# Patient Record
Sex: Male | Born: 1990 | Race: Black or African American | Hispanic: No | Marital: Married | Smoking: Current some day smoker
Health system: Southern US, Community
[De-identification: ages and names within clinical notes are randomized; demographics above are authoritative.]

## PROBLEM LIST (undated history)

## (undated) DIAGNOSIS — G43909 Migraine, unspecified, not intractable, without status migrainosus: Secondary | ICD-10-CM

## (undated) DIAGNOSIS — R569 Unspecified convulsions: Secondary | ICD-10-CM

## (undated) HISTORY — PX: OTHER SURGICAL HISTORY: SHX169

---
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
## 2010-02-20 ENCOUNTER — Emergency Department: Payer: Self-pay | Admitting: Emergency Medicine

## 2011-01-30 ENCOUNTER — Emergency Department: Payer: Self-pay | Admitting: Unknown Physician Specialty

## 2011-04-30 ENCOUNTER — Emergency Department: Payer: Self-pay | Admitting: *Deleted

## 2013-09-26 ENCOUNTER — Emergency Department: Payer: Self-pay | Admitting: Emergency Medicine

## 2015-07-30 ENCOUNTER — Encounter: Payer: Self-pay | Admitting: *Deleted

## 2015-07-30 ENCOUNTER — Emergency Department: Payer: Self-pay

## 2015-07-30 ENCOUNTER — Observation Stay
Admission: EM | Admit: 2015-07-30 | Discharge: 2015-08-01 | Disposition: A | Discharge status: Home / Self Care | Payer: Self-pay | Attending: Internal Medicine | Admitting: Internal Medicine

## 2015-07-30 DIAGNOSIS — G40909 Epilepsy, unspecified, not intractable, without status epilepticus: Secondary | ICD-10-CM | POA: Insufficient documentation

## 2015-07-30 DIAGNOSIS — F17210 Nicotine dependence, cigarettes, uncomplicated: Secondary | ICD-10-CM | POA: Insufficient documentation

## 2015-07-30 DIAGNOSIS — E669 Obesity, unspecified: Secondary | ICD-10-CM | POA: Insufficient documentation

## 2015-07-30 DIAGNOSIS — Z6834 Body mass index (BMI) 34.0-34.9, adult: Secondary | ICD-10-CM | POA: Insufficient documentation

## 2015-07-30 DIAGNOSIS — R569 Unspecified convulsions: Secondary | ICD-10-CM

## 2015-07-30 DIAGNOSIS — R509 Fever, unspecified: Secondary | ICD-10-CM | POA: Insufficient documentation

## 2015-07-30 DIAGNOSIS — Z833 Family history of diabetes mellitus: Secondary | ICD-10-CM | POA: Insufficient documentation

## 2015-07-30 DIAGNOSIS — R519 Headache, unspecified: Secondary | ICD-10-CM

## 2015-07-30 DIAGNOSIS — M542 Cervicalgia: Secondary | ICD-10-CM | POA: Insufficient documentation

## 2015-07-30 DIAGNOSIS — Z23 Encounter for immunization: Secondary | ICD-10-CM | POA: Insufficient documentation

## 2015-07-30 DIAGNOSIS — G039 Meningitis, unspecified: Secondary | ICD-10-CM | POA: Diagnosis present

## 2015-07-30 DIAGNOSIS — G030 Nonpyogenic meningitis: Principal | ICD-10-CM | POA: Insufficient documentation

## 2015-07-30 DIAGNOSIS — R51 Headache: Secondary | ICD-10-CM | POA: Insufficient documentation

## 2015-07-30 DIAGNOSIS — G43909 Migraine, unspecified, not intractable, without status migrainosus: Secondary | ICD-10-CM | POA: Insufficient documentation

## 2015-07-30 DIAGNOSIS — Z9114 Patient's other noncompliance with medication regimen: Secondary | ICD-10-CM | POA: Insufficient documentation

## 2015-07-30 HISTORY — DX: Unspecified convulsions: R56.9

## 2015-07-30 HISTORY — DX: Migraine, unspecified, not intractable, without status migrainosus: G43.909

## 2015-07-30 MED ORDER — VANCOMYCIN HCL 10 G IV SOLR
2000.0000 mg | Freq: Once | INTRAVENOUS | Status: AC
  Administered 2015-07-31: 05:00:00 2000 mg via INTRAVENOUS
  Filled 2015-07-30: qty 2000

## 2015-07-30 MED ORDER — DEXTROSE 5 % IV SOLN
1000.0000 mg | Freq: Once | INTRAVENOUS | Status: AC
  Administered 2015-07-31: 1000 mg via INTRAVENOUS
  Filled 2015-07-30: qty 20

## 2015-07-30 MED ORDER — MORPHINE SULFATE (PF) 4 MG/ML IV SOLN
4.0000 mg | Freq: Once | INTRAVENOUS | Status: AC
  Administered 2015-07-31: 4 mg via INTRAVENOUS
  Filled 2015-07-30: qty 1

## 2015-07-30 MED ORDER — ONDANSETRON HCL 4 MG/2ML IJ SOLN
4.0000 mg | Freq: Once | INTRAMUSCULAR | Status: AC
  Administered 2015-07-31: 4 mg via INTRAVENOUS
  Filled 2015-07-30: qty 2

## 2015-07-30 MED ORDER — DEXAMETHASONE SODIUM PHOSPHATE 10 MG/ML IJ SOLN
10.0000 mg | Freq: Once | INTRAMUSCULAR | Status: AC
  Administered 2015-07-31: 10 mg via INTRAVENOUS
  Filled 2015-07-30: qty 1

## 2015-07-30 MED ORDER — DEXTROSE 5 % IV SOLN
2.0000 g | INTRAVENOUS | Status: AC
  Administered 2015-07-31: 2 g via INTRAVENOUS
  Filled 2015-07-30: qty 2

## 2015-07-30 MED ORDER — SODIUM CHLORIDE 0.9 % IV BOLUS (SEPSIS)
1000.0000 mL | INTRAVENOUS | Status: AC
  Administered 2015-07-31: 1000 mL via INTRAVENOUS

## 2015-07-30 NOTE — ED Provider Notes (Signed)
[facility] Emergency Department Provider Note  ____________________________________________  Time seen: Approximately 11:17 PM  I have reviewed the triage vital signs and the nursing notes.   HISTORY  Chief Complaint Seizures    HPI Ricky Alvarado is a 24 y.o. male with a past medical history that includes seizure disorder previously on Dilantin but noncompliant with his medications for 2 years as well as a history of migraines who presents by EMS after a reported seizure episodeby family.  He states that he has not felt well all day and his daughter has been ill with an unspecified viral illness.  He has been much tighter than usual and has had a severe headache all day today.  He went to sleep for about 6 hours as afternoon.  When he got up he was feeling more ill but without anything specific except for the headache and photophobia.  He got up and was walking into another room and then does not remember what happened.  One of his family members reported that they heard a loud thump and when I went into the room they found him twitching and unresponsive on the floor.  It is unclear how long this episode lasted.  By the time I evaluated him in the emergency department he is alert and oriented complaining of photophobia, headache, and neck pain.  He has not had any nausea, vomiting, or diarrhea although he said his stomach feels a little bit messed up.  He had a fever of 101 upon arrival to the emergency department.  He states that his neck was not sore before his episode but that it is sore now all over when he moves it.  Moving his head and looking at bright lights makes it worse and nothing makes it better.  Overall he describes his symptoms as severe.   Past Medical History  Diagnosis Date  . Seizures (HCC)   . Migraine     There are no active problems to display for this patient.   History reviewed. No pertinent past surgical history.  No current  outpatient prescriptions on file.  Allergies Review of patient's allergies indicates no known allergies.  History reviewed. No pertinent family history.  Social History Social History  Substance Use Topics  . Smoking status: Current Every Day Smoker    Types: Cigarettes  . Smokeless tobacco: Never Used  . Alcohol Use: No    Review of Systems Constitutional: Fever to 101 Eyes: No visual changes.  Photophobia. ENT: No sore throat. Cardiovascular: Denies chest pain. Respiratory: Denies shortness of breath. Gastrointestinal: No abdominal pain.  No nausea, no vomiting.  No diarrhea.  No constipation. "Gassy" Genitourinary: Negative for dysuria. Musculoskeletal: Negative for back pain.  Has pain in his neck with movement Skin: Negative for rash. Neurological: Rear headache with photophobia.  Previously he reports numbness in his legs but it is now absent.  10-point ROS otherwise negative.  ____________________________________________   PHYSICAL EXAM:  VITAL SIGNS: ED Triage Vitals  Enc Vitals Group     BP 07/30/15 2256 150/84 mmHg     Pulse Rate 07/30/15 2256 93     Resp 07/30/15 2256 16     Temp 07/30/15 2256 101 F (38.3 C)     Temp Source 07/30/15 2256 Oral     SpO2 07/30/15 2256 97 %     Weight 07/30/15 2256 266 lb (120.657 kg)     Height 07/30/15 2256 6\' 2"  (1.88 m)     Head Cir --  Peak Flow --      Pain Score 07/30/15 2257 9     Pain Loc --      Pain Edu? --      Excl. in GC? --     Constitutional: Alert and oriented.  Appears uncomfortable but nontoxic Eyes: Conjunctivae are normal. PERRL. EOMI. Head: Atraumatic. Nose: No congestion/rhinnorhea. Mouth/Throat: Mucous membranes are moist.  Oropharynx non-erythematous. Neck: No stridor.  No cervical spinal bony tenderness to palpation but the patient reports soft tissue tenderness and pain throughout his neck Cardiovascular: Borderline tachycardia, regular rhythm. Grossly normal heart sounds.  Good  peripheral circulation. Respiratory: Normal respiratory effort.  No retractions. Lungs CTAB. Gastrointestinal: Soft and nontender. No distention. No abdominal bruits. No CVA tenderness. Musculoskeletal: No lower extremity tenderness nor edema.  No joint effusions. Neurologic:  Normal speech and language. No gross focal neurologic deficits are appreciated.  The patient is able to flex and extend his neck as well as rotating side to side but he does report tenderness when he does so.  However he does not present with a stiff neck or classic meningismus. Skin:  Skin is warm, dry and intact. No rash noted. Psychiatric: Mood and affect are normal. Speech and behavior are normal.  ____________________________________________   LABS (all labs ordered are listed, but only abnormal results are displayed)  Labs Reviewed  CBC WITH DIFFERENTIAL/PLATELET - Abnormal; Notable for the following:    WBC 12.9 (*)    Neutro Abs 9.9 (*)    Monocytes Absolute 1.3 (*)    All other components within normal limits  COMPREHENSIVE METABOLIC PANEL - Abnormal; Notable for the following:    CO2 21 (*)    Total Bilirubin 1.5 (*)    All other components within normal limits  CULTURE, BLOOD (ROUTINE X 2)  CULTURE, BLOOD (ROUTINE X 2)  LACTIC ACID, PLASMA  URINALYSIS COMPLETEWITH MICROSCOPIC ([facility] ONLY)  LACTIC ACID, PLASMA  PROLACTIN   ____________________________________________  EKG  Not indicated ____________________________________________  RADIOLOGY   Ct Head Wo Contrast  07/31/2015  CLINICAL DATA:  Fever, headache, and seizure tonight. Head and neck pain now. History of seizures. Smoker. EXAM: CT HEAD WITHOUT CONTRAST CT CERVICAL SPINE WITHOUT CONTRAST TECHNIQUE: Multidetector CT imaging of the head and cervical spine was performed following the standard protocol without intravenous contrast. Multiplanar CT image reconstructions of the cervical spine were also generated. COMPARISON:  CT head  04/30/2011 FINDINGS: CT HEAD FINDINGS Ventricles and sulci appear symmetrical. No ventricular dilatation. No mass effect or midline shift. No abnormal extra-axial fluid collections. Gray-white matter junctions are distinct. Basal cisterns are not effaced. No evidence of acute intracranial hemorrhage. No depressed skull fractures. Visualized paranasal sinuses and mastoid air cells are not opacified. CT CERVICAL SPINE FINDINGS Straightening of the usual cervical lordosis. This may be due to patient positioning but ligamentous injury or muscle spasm could also have this appearance and is not excluded. No anterior subluxation. Normal alignment of the posterior elements. No vertebral compression deformities. Intervertebral disc space heights are preserved. No prevertebral soft tissue swelling. C1-2 articulation appears intact. No focal bone lesion or bone destruction. Bone cortex and trabecular architecture appear intact. IMPRESSION: No acute intracranial abnormalities. Nonspecific straightening of usual cervical lordosis. No acute displaced fractures identified. Electronically Signed   By: Burman Nieves M.D.   On: 07/31/2015 00:16   Ct Cervical Spine Wo Contrast  07/31/2015  CLINICAL DATA:  Fever, headache, and seizure tonight. Head and neck pain now. History of seizures. Smoker. EXAM: CT  HEAD WITHOUT CONTRAST CT CERVICAL SPINE WITHOUT CONTRAST TECHNIQUE: Multidetector CT imaging of the head and cervical spine was performed following the standard protocol without intravenous contrast. Multiplanar CT image reconstructions of the cervical spine were also generated. COMPARISON:  CT head 04/30/2011 FINDINGS: CT HEAD FINDINGS Ventricles and sulci appear symmetrical. No ventricular dilatation. No mass effect or midline shift. No abnormal extra-axial fluid collections. Gray-white matter junctions are distinct. Basal cisterns are not effaced. No evidence of acute intracranial hemorrhage. No depressed skull fractures.  Visualized paranasal sinuses and mastoid air cells are not opacified. CT CERVICAL SPINE FINDINGS Straightening of the usual cervical lordosis. This may be due to patient positioning but ligamentous injury or muscle spasm could also have this appearance and is not excluded. No anterior subluxation. Normal alignment of the posterior elements. No vertebral compression deformities. Intervertebral disc space heights are preserved. No prevertebral soft tissue swelling. C1-2 articulation appears intact. No focal bone lesion or bone destruction. Bone cortex and trabecular architecture appear intact. IMPRESSION: No acute intracranial abnormalities. Nonspecific straightening of usual cervical lordosis. No acute displaced fractures identified. Electronically Signed   By: Burman Nieves M.D.   On: 07/31/2015 00:16    ____________________________________________   PROCEDURES  Procedure(s) performed: None  Critical Care performed: Yes, see critical care note(s)  CRITICAL CARE Performed by: Loleta Rose   Total critical care time: 30 minutes  Critical care time was exclusive of separately billable procedures and treating other patients.  Critical care was necessary to treat or prevent imminent or life-threatening deterioration.  Critical care was time spent personally by me on the following activities: development of treatment plan with patient and/or surrogate as well as nursing, discussions with consultants, evaluation of patient's response to treatment, examination of patient, obtaining history from patient or surrogate, ordering and performing treatments and interventions, ordering and review of laboratory studies, ordering and review of radiographic studies, pulse oximetry and re-evaluation of patient's condition.  ____________________________________________   INITIAL IMPRESSION / ASSESSMENT AND PLAN / ED COURSE  Pertinent labs & imaging results that were available during my care of the  patient were reviewed by me and considered in my medical decision making (see chart for details).  The patient is alert and oriented at this time.  Given his constellation of symptoms that include fever, headache, photophobia, seizure disorder, neck pain, and no other specific focus or obvious source of infection, there is concern for meningitis.  I ordered airborne precautions and empiric ceftriaxone 2 g IV, vancomycin 2 g IV, and acyclovir 1 g IV as well as Decadron 10 mg IV.  I am obtaining a head CT and C-spine CT both to evaluate for the headache and seizure and possible meningitis as well as for his fall.  I saw the patient immediately after was told about him and explained our concerns.  I had my usual and customary discussion about the risks of meningitis including death and permanent disability and explained in detail why we need to obtain a lumbar puncture.  The patient adamantly refuses and states that he would rather die than have a needle put in his back.  I tried twice to explain why this is important it was unsuccessful.  My charge nurse, Fleet Contras, also discussed with the patient even while the patient was on the phone with his mother, and he continues to refuse the lumbar puncture.  He has capacity to make this decision and I cannot force a needle into his back.  I will treat him empirically and  complete the rest of my workup and plan to admit him for observation given the concern for possible meningitis.  I have discussed it with the hospitalist who will evaluate and admit the patient as per our discussion.  ____________________________________________  FINAL CLINICAL IMPRESSION(S) / ED DIAGNOSES  Final diagnoses:  Possible meningitis, likely viral Acute intractable headache, unspecified headache type  Fever, unspecified fever cause  Seizure (HCC)  Neck pain      NEW MEDICATIONS STARTED DURING THIS VISIT:  New Prescriptions   No medications on file     Loleta Rose,  MD 07/31/15 [phone]

## 2015-07-30 NOTE — ED Notes (Signed)
Pt presents via EMS post seizure, unknown if witnessed. Pt is febrile and somnolent. Pt c/o headache and neck pain. Pt states he fell and hit his head.

## 2015-07-31 DIAGNOSIS — G039 Meningitis, unspecified: Secondary | ICD-10-CM | POA: Diagnosis present

## 2015-07-31 LAB — CBC WITH DIFFERENTIAL/PLATELET
Basophils Absolute: 0.1 K/uL (ref 0–0.1)
Basophils Relative: 0 %
Eosinophils Absolute: 0.3 K/uL (ref 0–0.7)
Eosinophils Relative: 3 %
HCT: 46.5 % (ref 40.0–52.0)
Hemoglobin: 15.3 g/dL (ref 13.0–18.0)
Lymphocytes Relative: 10 %
Lymphs Abs: 1.3 K/uL (ref 1.0–3.6)
MCH: 31.8 pg (ref 26.0–34.0)
MCHC: 33 g/dL (ref 32.0–36.0)
MCV: 96.3 fL (ref 80.0–100.0)
Monocytes Absolute: 1.3 K/uL — ABNORMAL HIGH (ref 0.2–1.0)
Monocytes Relative: 10 %
Neutro Abs: 9.9 K/uL — ABNORMAL HIGH (ref 1.4–6.5)
Neutrophils Relative %: 77 %
Platelets: 187 K/uL (ref 150–440)
RBC: 4.83 MIL/uL (ref 4.40–5.90)
RDW: 13.1 % (ref 11.5–14.5)
WBC: 12.9 K/uL — ABNORMAL HIGH (ref 3.8–10.6)

## 2015-07-31 LAB — URINALYSIS COMPLETE WITH MICROSCOPIC (ARMC ONLY)
Bacteria, UA: NONE SEEN
Bilirubin Urine: NEGATIVE
Glucose, UA: NEGATIVE mg/dL
Ketones, ur: NEGATIVE mg/dL
Leukocytes, UA: NEGATIVE
Nitrite: NEGATIVE
Protein, ur: NEGATIVE mg/dL
Specific Gravity, Urine: 1.014 (ref 1.005–1.030)
pH: 5 (ref 5.0–8.0)

## 2015-07-31 LAB — COMPREHENSIVE METABOLIC PANEL WITH GFR
ALT: 50 U/L (ref 17–63)
AST: 26 U/L (ref 15–41)
Albumin: 4.6 g/dL (ref 3.5–5.0)
Alkaline Phosphatase: 56 U/L (ref 38–126)
Anion gap: 8 (ref 5–15)
BUN: 13 mg/dL (ref 6–20)
CO2: 21 mmol/L — ABNORMAL LOW (ref 22–32)
Calcium: 8.9 mg/dL (ref 8.9–10.3)
Chloride: 108 mmol/L (ref 101–111)
Creatinine, Ser: 1.1 mg/dL (ref 0.61–1.24)
GFR calc Af Amer: >60 mL/min
GFR calc non Af Amer: >60 mL/min
Glucose, Bld: 90 mg/dL (ref 65–99)
Potassium: 3.7 mmol/L (ref 3.5–5.1)
Sodium: 137 mmol/L (ref 135–145)
Total Bilirubin: 1.5 mg/dL — ABNORMAL HIGH (ref 0.3–1.2)
Total Protein: 7.5 g/dL (ref 6.5–8.1)

## 2015-07-31 LAB — TSH: TSH: 1.272 u[IU]/mL (ref 0.350–4.500)

## 2015-07-31 LAB — LACTIC ACID, PLASMA
Lactic Acid, Venous: 1.1 mmol/L (ref 0.5–2.0)
Lactic Acid, Venous: 1.4 mmol/L (ref 0.5–2.0)

## 2015-07-31 LAB — HEMOGLOBIN A1C: Hgb A1c MFr Bld: 5.7 % (ref 4.0–6.0)

## 2015-07-31 MED ORDER — MORPHINE SULFATE (PF) 2 MG/ML IV SOLN
2.0000 mg | INTRAVENOUS | Status: DC | PRN
  Administered 2015-07-31 (×2): 2 mg via INTRAVENOUS
  Filled 2015-07-31 (×2): qty 1

## 2015-07-31 MED ORDER — SODIUM CHLORIDE 0.9 % IJ SOLN
3.0000 mL | Freq: Two times a day (BID) | INTRAMUSCULAR | Status: DC
  Administered 2015-07-31 (×2): 3 mL via INTRAVENOUS

## 2015-07-31 MED ORDER — SODIUM CHLORIDE 0.9 % IV SOLN: 10.0000 mg/kg | INTRAVENOUS | Status: DC

## 2015-07-31 MED ORDER — ONDANSETRON HCL 4 MG/2ML IJ SOLN: 4.0000 mg | Freq: Four times a day (QID) | INTRAMUSCULAR | Status: DC | PRN

## 2015-07-31 MED ORDER — SODIUM CHLORIDE 0.9 % IV SOLN
INTRAVENOUS | Status: DC
  Administered 2015-07-31 (×2): via INTRAVENOUS

## 2015-07-31 MED ORDER — SODIUM CHLORIDE 0.9 % IV SOLN
1200.0000 mg | INTRAVENOUS | Status: AC
  Administered 2015-07-31: 1200 mg via INTRAVENOUS
  Filled 2015-07-31: qty 24

## 2015-07-31 MED ORDER — ACETAMINOPHEN 650 MG RE SUPP: 650.0000 mg | Freq: Four times a day (QID) | RECTAL | Status: DC | PRN

## 2015-07-31 MED ORDER — PHENYTOIN SODIUM EXTENDED 100 MG PO CAPS
300.0000 mg | ORAL_CAPSULE | Freq: Every day | ORAL | Status: DC
  Administered 2015-07-31: 21:00:00 300 mg via ORAL
  Filled 2015-07-31: qty 3

## 2015-07-31 MED ORDER — INFLUENZA VAC SPLIT QUAD 0.5 ML IM SUSY
0.5000 mL | PREFILLED_SYRINGE | INTRAMUSCULAR | Status: AC
  Administered 2015-08-01: 12:00:00 0.5 mL via INTRAMUSCULAR
  Filled 2015-07-31: qty 0.5

## 2015-07-31 MED ORDER — BUTALBITAL-APAP-CAFFEINE 50-325-40 MG PO TABS
1.0000 | ORAL_TABLET | ORAL | Status: DC | PRN
  Administered 2015-07-31: 13:00:00 1 via ORAL
  Filled 2015-07-31: qty 1

## 2015-07-31 MED ORDER — PNEUMOCOCCAL VAC POLYVALENT 25 MCG/0.5ML IJ INJ
0.5000 mL | INJECTION | INTRAMUSCULAR | Status: AC
  Administered 2015-08-01: 12:00:00 0.5 mL via INTRAMUSCULAR
  Filled 2015-07-31: qty 0.5

## 2015-07-31 MED ORDER — ACETAMINOPHEN 325 MG PO TABS: 650.0000 mg | ORAL_TABLET | Freq: Four times a day (QID) | ORAL | Status: DC | PRN

## 2015-07-31 MED ORDER — ONDANSETRON HCL 4 MG PO TABS: 4.0000 mg | ORAL_TABLET | Freq: Four times a day (QID) | ORAL | Status: DC | PRN

## 2015-07-31 NOTE — H&P (Signed)
Ricky Alvarado is an 24 y.o. male.   Chief Complaint: Seizure HPI: The patient presents emergency department complaining of fever, general malaise and seizure. The patient states that he began feeling unwell this afternoon. He's had some sick contacts including his infant daughter. He admits to feeling feverish this afternoon and had a temperature of 101F in the emergency department. Wife states that he was found seizing on the floor by his sister. She reports that it was tonic-clonic involving his whole body. The seizure reportedly last 2-3 minutes. The patient reports history of seizures as a child, the last which was at least 3 years ago. He does not take medicine for his seizure disorder. Emergency department the patient also complained of photophobia as well as neck stiffness. The emergency department physician recommended lumbar puncture but the patient refused. Emergency department initiated antibiotic coverage as well as antiviral coverage for presumed meningitis and called the hospitalist service for admission.  Past Medical History  Diagnosis Date  . Seizures ([place])   . Migraine     Past Surgical History  Procedure Laterality Date  . None       Family History  Problem Relation Age of Onset  . Diabetes Mellitus II Mother     Social History:  reports that he has been smoking Cigarettes.  He has been smoking about 0.50 packs per day. He has never used smokeless tobacco. He reports that he does not drink alcohol or use illicit drugs.  Allergies: No Known Allergies  No prescriptions prior to admission    Results for orders placed or performed during the hospital encounter of 07/30/15 (from the past 48 hour(s))  TSH     Status: None   Collection Time: 07/30/15  3:23 AM  Result Value Ref Range   TSH 1.272 0.350 - 4.500 uIU/mL  CBC with Differential/Platelet     Status: Abnormal   Collection Time: 07/31/15 12:28 AM  Result Value Ref Range   WBC 12.9 (H) 3.8 - 10.6 K/uL   RBC  4.83 4.40 - 5.90 MIL/uL   Hemoglobin 15.3 13.0 - 18.0 g/dL   HCT 46.5 40.0 - 52.0 %   MCV 96.3 80.0 - 100.0 fL   MCH 31.8 26.0 - 34.0 pg   MCHC 33.0 32.0 - 36.0 g/dL   RDW 13.1 11.5 - 14.5 %   Platelets 187 150 - 440 K/uL   Neutrophils Relative % 77 %   Neutro Abs 9.9 (H) 1.4 - 6.5 K/uL   Lymphocytes Relative 10 %   Lymphs Abs 1.3 1.0 - 3.6 K/uL   Monocytes Relative 10 %   Monocytes Absolute 1.3 (H) 0.2 - 1.0 K/uL   Eosinophils Relative 3 %   Eosinophils Absolute 0.3 0 - 0.7 K/uL   Basophils Relative 0 %   Basophils Absolute 0.1 0 - 0.1 K/uL  Comprehensive metabolic panel     Status: Abnormal   Collection Time: 07/31/15 12:28 AM  Result Value Ref Range   Sodium 137 135 - 145 mmol/L   Potassium 3.7 3.5 - 5.1 mmol/L   Chloride 108 101 - 111 mmol/L   CO2 21 (L) 22 - 32 mmol/L   Glucose, Bld 90 65 - 99 mg/dL   BUN 13 6 - 20 mg/dL   Creatinine, Ser 1.10 0.61 - 1.24 mg/dL   Calcium 8.9 8.9 - 10.3 mg/dL   Total Protein 7.5 6.5 - 8.1 g/dL   Albumin 4.6 3.5 - 5.0 g/dL   AST 26 15 - 41  U/L   ALT 50 17 - 63 U/L   Alkaline Phosphatase 56 38 - 126 U/L   Total Bilirubin 1.5 (H) 0.3 - 1.2 mg/dL   GFR calc non Af Amer >60 >60 mL/min   GFR calc Af Amer >60 >60 mL/min    Comment: (NOTE) The eGFR has been calculated using the CKD EPI equation. This calculation has not been validated in all clinical situations. eGFR's persistently <60 mL/min signify possible Chronic Kidney Disease.    Anion gap 8 5 - 15  Lactic acid, plasma     Status: None   Collection Time: 07/31/15 12:28 AM  Result Value Ref Range   Lactic Acid, Venous 1.1 0.5 - 2.0 mmol/L  Urinalysis complete, with microscopic ([facility] only)     Status: Abnormal   Collection Time: 07/31/15  2:27 AM  Result Value Ref Range   Color, Urine YELLOW (A) YELLOW   APPearance CLEAR (A) CLEAR   Glucose, UA NEGATIVE NEGATIVE mg/dL   Bilirubin Urine NEGATIVE NEGATIVE   Ketones, ur NEGATIVE NEGATIVE mg/dL   Specific Gravity, Urine 1.014 1.005  - 1.030   Hgb urine dipstick 1+ (A) NEGATIVE   pH 5.0 5.0 - 8.0   Protein, ur NEGATIVE NEGATIVE mg/dL   Nitrite NEGATIVE NEGATIVE   Leukocytes, UA NEGATIVE NEGATIVE   RBC / HPF 0-5 0 - 5 RBC/hpf   WBC, UA 0-5 0 - 5 WBC/hpf   Bacteria, UA NONE SEEN NONE SEEN   Squamous Epithelial / LPF 0-5 (A) NONE SEEN   Mucous PRESENT   Lactic acid, plasma     Status: None   Collection Time: 07/31/15  3:51 AM  Result Value Ref Range   Lactic Acid, Venous 1.4 0.5 - 2.0 mmol/L   Ct Head Wo Contrast  07/31/2015  CLINICAL DATA:  Fever, headache, and seizure tonight. Head and neck pain now. History of seizures. Smoker. EXAM: CT HEAD WITHOUT CONTRAST CT CERVICAL SPINE WITHOUT CONTRAST TECHNIQUE: Multidetector CT imaging of the head and cervical spine was performed following the standard protocol without intravenous contrast. Multiplanar CT image reconstructions of the cervical spine were also generated. COMPARISON:  CT head 04/30/2011 FINDINGS: CT HEAD FINDINGS Ventricles and sulci appear symmetrical. No ventricular dilatation. No mass effect or midline shift. No abnormal extra-axial fluid collections. Gray-white matter junctions are distinct. Basal cisterns are not effaced. No evidence of acute intracranial hemorrhage. No depressed skull fractures. Visualized paranasal sinuses and mastoid air cells are not opacified. CT CERVICAL SPINE FINDINGS Straightening of the usual cervical lordosis. This may be due to patient positioning but ligamentous injury or muscle spasm could also have this appearance and is not excluded. No anterior subluxation. Normal alignment of the posterior elements. No vertebral compression deformities. Intervertebral disc space heights are preserved. No prevertebral soft tissue swelling. C1-2 articulation appears intact. No focal bone lesion or bone destruction. Bone cortex and trabecular architecture appear intact. IMPRESSION: No acute intracranial abnormalities. Nonspecific straightening of usual  cervical lordosis. No acute displaced fractures identified. Electronically Signed   By: Lucienne Capers M.D.   On: 07/31/2015 00:16   Ct Cervical Spine Wo Contrast  07/31/2015  CLINICAL DATA:  Fever, headache, and seizure tonight. Head and neck pain now. History of seizures. Smoker. EXAM: CT HEAD WITHOUT CONTRAST CT CERVICAL SPINE WITHOUT CONTRAST TECHNIQUE: Multidetector CT imaging of the head and cervical spine was performed following the standard protocol without intravenous contrast. Multiplanar CT image reconstructions of the cervical spine were also generated. COMPARISON:  CT head 04/30/2011  FINDINGS: CT HEAD FINDINGS Ventricles and sulci appear symmetrical. No ventricular dilatation. No mass effect or midline shift. No abnormal extra-axial fluid collections. Gray-white matter junctions are distinct. Basal cisterns are not effaced. No evidence of acute intracranial hemorrhage. No depressed skull fractures. Visualized paranasal sinuses and mastoid air cells are not opacified. CT CERVICAL SPINE FINDINGS Straightening of the usual cervical lordosis. This may be due to patient positioning but ligamentous injury or muscle spasm could also have this appearance and is not excluded. No anterior subluxation. Normal alignment of the posterior elements. No vertebral compression deformities. Intervertebral disc space heights are preserved. No prevertebral soft tissue swelling. C1-2 articulation appears intact. No focal bone lesion or bone destruction. Bone cortex and trabecular architecture appear intact. IMPRESSION: No acute intracranial abnormalities. Nonspecific straightening of usual cervical lordosis. No acute displaced fractures identified. Electronically Signed   By: Lucienne Capers M.D.   On: 07/31/2015 00:16    Review of Systems  Constitutional: Positive for fever and chills.  HENT: Negative for sore throat and tinnitus.   Eyes: Positive for photophobia. Negative for blurred vision and redness.   Respiratory: Negative for cough and shortness of breath.   Cardiovascular: Negative for chest pain, palpitations, orthopnea and PND.  Gastrointestinal: Negative for nausea, vomiting, abdominal pain and diarrhea.  Genitourinary: Negative for dysuria, urgency and frequency.  Musculoskeletal: Negative for myalgias and joint pain.  Skin: Negative for rash.       No lesions  Neurological: Positive for headaches. Negative for speech change, focal weakness and weakness.  Endo/Heme/Allergies: Does not bruise/bleed easily.       No temperature intolerance  Psychiatric/Behavioral: Negative for depression and suicidal ideas.    Blood pressure 123/70, pulse 70, temperature 97.8 F (36.6 C), temperature source Oral, resp. rate 18, height _0  (1.88 m), weight 123.106 kg (271 lb 6.4 oz), SpO2 100 %. Physical Exam  Nursing note and vitals reviewed. Constitutional: He is oriented to person, place, and time. He appears well-developed and well-nourished. No distress.  HENT:  Head: Normocephalic and atraumatic.  Mouth/Throat: Oropharynx is clear and moist.  Eyes: Conjunctivae and EOM are normal. Pupils are equal, round, and reactive to light. No scleral icterus.  Neck: Normal range of motion. Neck supple. No JVD present. No tracheal deviation present. No thyromegaly present.  Cardiovascular: Normal rate, regular rhythm and normal heart sounds.  Exam reveals no gallop and no friction rub.   No murmur heard. Respiratory: Effort normal and breath sounds normal. No respiratory distress. He has no wheezes.  GI: Soft. Bowel sounds are normal. He exhibits no distension. There is no tenderness.  Genitourinary:  Deferred  Musculoskeletal: Normal range of motion. He exhibits no edema.  Lymphadenopathy:    He has no cervical adenopathy.  Neurological: He is alert and oriented to person, place, and time. No cranial nerve deficit.  Skin: Skin is warm and dry. No rash noted. No erythema.  Psychiatric: He has a  normal mood and affect. His behavior is normal. Judgment and thought content normal.     Assessment/Plan This is a 24 year old [place] American male admitted for meningitic symptoms. 1. Meningitis: Clinically present: Likely aseptic. However, the patient will not let us perform lumbar puncture. And tingling to treat for both bacterial and viral meningitis without an endpoint may be unwise but it is clear that he should not be around his infant child until he is cleared for HSV. 2. Fever: Tylenol as needed 3. Seizure: Loaded with Dilantin in the emergency department. Consult  neuro if further seizure activity. May need to restart antiepileptic medication as an outpatient especially if the patient refuses further care for possible meningitis which will lower her seizure threshold. 4. Obesity: BMI is 34.3; encourage healthy diet and exercise 5. Care management consult: Need to establish an of care including discharge disposition regarding the patient's interaction with his daughter if he is not owing to comply with treatment 6. DVT prophylaxis: SCDs due to early ambulation 7. GI prophylaxis: None as the patient is not critically ill The patient is a full code. Time spent on admission orders and patient care approximately 45 minutes  Harrie Foreman 07/31/2015, 5:39 AM

## 2015-07-31 NOTE — Progress Notes (Signed)
[facility] - [place] at [facility]   PATIENT NAME: Ricky Alvarado    MR#:  [id number]  DATE OF BIRTH:  [birth year]  SUBJECTIVE:  Came in with fever 101 point for headache and neck stiffness. Patient seems to be feeling better although does have some generalized headache which he thinks is his migraine headache. No nausea vomiting. Tolerating food well. No fever.  REVIEW OF SYSTEMS:   Review of Systems  Constitutional: Negative for fever, chills and weight loss.  HENT: Negative for ear discharge, ear pain and nosebleeds.   Eyes: Negative for blurred vision, pain and discharge.  Respiratory: Negative for sputum production, shortness of breath, wheezing and stridor.   Cardiovascular: Negative for chest pain, palpitations, orthopnea and PND.  Gastrointestinal: Negative for nausea, vomiting, abdominal pain and diarrhea.  Genitourinary: Negative for urgency and frequency.  Musculoskeletal: Positive for neck pain. Negative for back pain and joint pain.  Neurological: Positive for weakness and headaches. Negative for sensory change, speech change and focal weakness.  Psychiatric/Behavioral: Negative for depression and hallucinations. The patient is not nervous/anxious.   All other systems reviewed and are negative.  Tolerating Diet: Yes Tolerating PT: Not needed  DRUG ALLERGIES:  No Known Allergies  VITALS:  Blood pressure 123/70, pulse 70, temperature 98.7 F (37.1 C), temperature source Oral, resp. rate 18, height 6\' 2"  (1.88 m), weight 123.106 kg (271 lb 6.4 oz), SpO2 100 %.  PHYSICAL EXAMINATION:   Physical Exam  GENERAL:  24 y.o.-year-old patient lying in the bed with no acute distress.  EYES: Pupils equal, round, reactive to light and accommodation. No scleral icterus. Extraocular muscles intact.  HEENT: Head atraumatic, normocephalic. Oropharynx and nasopharynx clear.  NECK:  Supple, no jugular venous distention. No thyroid enlargement, no  tenderness. No neck stiffness LUNGS: Normal breath sounds bilaterally, no wheezing, rales, rhonchi. No use of accessory muscles of respiration.  CARDIOVASCULAR: S1, S2 normal. No murmurs, rubs, or gallops.  ABDOMEN: Soft, nontender, nondistended. Bowel sounds present. No organomegaly or mass.  EXTREMITIES: No cyanosis, clubbing or edema b/l.    NEUROLOGIC: Cranial nerves II through XII are intact. No focal Motor or sensory deficits b/l.   PSYCHIATRIC: The patient is alert and oriented x 3.  SKIN: No obvious rash, lesion, or ulcer.    LABORATORY PANEL:   CBC  Recent Labs Lab 07/31/15 0028  WBC 12.9*  HGB 15.3  HCT 46.5  PLT 187    Chemistries   Recent Labs Lab 07/31/15 0028  NA 137  K 3.7  CL 108  CO2 21*  GLUCOSE 90  BUN 13  CREATININE 1.10  CALCIUM 8.9  AST 26  ALT 50  ALKPHOS 56  BILITOT 1.5*    Cardiac Enzymes No results for input(s): TROPONINI in the last 168 hours.  RADIOLOGY:  Ct Head Wo Contrast  07/31/2015  CLINICAL DATA:  Fever, headache, and seizure tonight. Head and neck pain now. History of seizures. Smoker. EXAM: CT HEAD WITHOUT CONTRAST CT CERVICAL SPINE WITHOUT CONTRAST TECHNIQUE: Multidetector CT imaging of the head and cervical spine was performed following the standard protocol without intravenous contrast. Multiplanar CT image reconstructions of the cervical spine were also generated. COMPARISON:  CT head 04/30/2011 FINDINGS: CT HEAD FINDINGS Ventricles and sulci appear symmetrical. No ventricular dilatation. No mass effect or midline shift. No abnormal extra-axial fluid collections. Gray-white matter junctions are distinct. Basal cisterns are not effaced. No evidence of acute intracranial hemorrhage. No depressed skull fractures. Visualized paranasal sinuses and mastoid air  cells are not opacified. CT CERVICAL SPINE FINDINGS Straightening of the usual cervical lordosis. This may be due to patient positioning but ligamentous injury or muscle spasm  could also have this appearance and is not excluded. No anterior subluxation. Normal alignment of the posterior elements. No vertebral compression deformities. Intervertebral disc space heights are preserved. No prevertebral soft tissue swelling. C1-2 articulation appears intact. No focal bone lesion or bone destruction. Bone cortex and trabecular architecture appear intact. IMPRESSION: No acute intracranial abnormalities. Nonspecific straightening of usual cervical lordosis. No acute displaced fractures identified. Electronically Signed   By: Burman Nieves M.D.   On: 07/31/2015 00:16   Ct Cervical Spine Wo Contrast  07/31/2015  CLINICAL DATA:  Fever, headache, and seizure tonight. Head and neck pain now. History of seizures. Smoker. EXAM: CT HEAD WITHOUT CONTRAST CT CERVICAL SPINE WITHOUT CONTRAST TECHNIQUE: Multidetector CT imaging of the head and cervical spine was performed following the standard protocol without intravenous contrast. Multiplanar CT image reconstructions of the cervical spine were also generated. COMPARISON:  CT head 04/30/2011 FINDINGS: CT HEAD FINDINGS Ventricles and sulci appear symmetrical. No ventricular dilatation. No mass effect or midline shift. No abnormal extra-axial fluid collections. Gray-white matter junctions are distinct. Basal cisterns are not effaced. No evidence of acute intracranial hemorrhage. No depressed skull fractures. Visualized paranasal sinuses and mastoid air cells are not opacified. CT CERVICAL SPINE FINDINGS Straightening of the usual cervical lordosis. This may be due to patient positioning but ligamentous injury or muscle spasm could also have this appearance and is not excluded. No anterior subluxation. Normal alignment of the posterior elements. No vertebral compression deformities. Intervertebral disc space heights are preserved. No prevertebral soft tissue swelling. C1-2 articulation appears intact. No focal bone lesion or bone destruction. Bone cortex  and trabecular architecture appear intact. IMPRESSION: No acute intracranial abnormalities. Nonspecific straightening of usual cervical lordosis. No acute displaced fractures identified. Electronically Signed   By: Burman Nieves M.D.   On: 07/31/2015 00:16   ASSESSMENT AND PLAN:   24 year old African American male admitted for meningitic symptoms. 1. aseptic Meningitis: Clinically present: Likely aseptic.  -Clinical presentation appears to be aseptic meningitis. Patient currently is afebrile. White count is 12.6. Able to tolerate diet. No indications for antibiotics. Supportive care. -No seizures noted. Patient has been off seizure medicines for last 3 years. -Declined lumbar puncture yesterday  2. Fever: Tylenol as needed -Resolved  3. History of Seizure: Loaded with Dilantin in the emergency department.  -No clear-cut history of seizures yesterday however family reports patient shivering/shaking. Given IV Dilantin. We'll continue by mouth Dilantin. --Seizure precautions. -Patient is advised to follow up at [facility] neurology for outpatient medication and follow-up on seizures. He has no insurance  4. Obesity: BMI is 34.3; encourage healthy diet and exercise  5. History of migraine headaches. -Try Fioricet today.  Case discussed with Care Management/Social Worker. Management plans discussed with the patient, family and they are in agreement.  CODE STATUS: Full  DVT Prophylaxis: SCD teds ambulator  TOTAL TIME TAKING CARE OF THIS PATIENT: *30 minutes.  >50% time spent on counselling and coordination of care patient and staff     Briselda Naval M.D on 07/31/2015 at 12:08 PM  Between 7am to 6pm - Pager - [phone]  After 6pm go to [email or web address] - password [email or web address] [facility]  [facility]  Office  [phone]  CC: Primary care physician; No PCP Per Patient

## 2015-07-31 NOTE — Plan of Care (Signed)
Problem: Pain Managment: Goal: General experience of comfort will improve Outcome: Progressing Patient admitted this shift for headache and questionable seziure activity, afebrile since admission did report fever in ed, patient refused  Lumbar puncture, on droplet precations since unable to rule out menengitis. Patient is high fall risk, call bell in reach.

## 2015-07-31 NOTE — Progress Notes (Signed)
Patient c/o pain x2 relieved well with prn morphine 2mg  and Fioricet relieved headache well  IV fluids discontinued this shift No Seizure activity noted patient A&O this shift

## 2015-08-01 LAB — PROLACTIN: PROLACTIN: 7.2 ng/mL (ref 4.0–15.2)

## 2015-08-01 MED ORDER — PHENYTOIN SODIUM EXTENDED 300 MG PO CAPS: 300.0000 mg | ORAL_CAPSULE | Freq: Every day | ORAL | Status: DC

## 2015-08-01 NOTE — Care Management (Addendum)
Spoke with patient regarding discharge planning. He does not have any insurance and states he has not been taking his Dilantin because "he cannot afford it". He is aware of [facility] but has not followed up. He has never been told about [facility] or [facility] but agrees.  Referral made by this RNCM to both. Patient states his contact is [phone]. Application to both delivered to this patient. 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 He states he has made multiple attempts to "get [place] [facility] but all times they have denied him". No further RNCM needs. Case closed.

## 2015-08-01 NOTE — Discharge Summary (Signed)
[facility] - [place] at [facility]   PATIENT NAME: Ricky Alvarado    MR#:  [id number]  DATE OF BIRTH:  05-21-1991  DATE OF ADMISSION:  07/30/2015 ADMITTING PHYSICIAN: Arnaldo Natal, MD  DATE OF DISCHARGE: 08/01/15  PRIMARY CARE PHYSICIAN: No PCP Per Patient    ADMISSION DIAGNOSIS:  Neck pain [M54.2] Seizure (HCC) [R56.9] Fever, unspecified fever cause [R50.9] Acute intractable headache, unspecified headache type [R51]  DISCHARGE DIAGNOSIS:  Aseptic meningitis H/o seizures with possible recurrent at home (per family)  SECONDARY DIAGNOSIS:   Past Medical History  Diagnosis Date  . Seizures (HCC)   . Migraine     HOSPITAL COURSE:  24 year old African American male admitted for meningitic symptoms. 1. aseptic Meningitis: Likely aseptic.  -Clinical presentation appears to be aseptic meningitis. Patient currently is afebrile. White count is 12.6. Able to tolerate diet. No indications for antibiotics. Supportive care. -No seizures noted. Patient has been off seizure medicines for last 3 years. -Declined lumbar puncture yesterday  2. Fever: Tylenol as needed -Resolved  3. History of Seizure: Loaded with Dilantin in the emergency department.  -No clear-cut history of seizures yesterday however family reports patient shivering/shaking. Given IV Dilantin. We'll continue by mouth Dilantin. --Seizure precautions. -Patient is advised to follow up at [facility] neurology for outpatient medication and follow-up on seizures. He has no insurance -he is advised not to drive till cleared by his PCP or if he gets to see neuro at [facility]  4. Obesity: BMI is 34.3; encourage healthy diet and exercise  5. History of migraine headaches. -resovled  D/c home  CONSULTS OBTAINED:   done DRUG ALLERGIES:  No Known Allergies  DISCHARGE MEDICATIONS:   Current Discharge Medication List    START taking these medications   Details  phenytoin (DILANTIN) 300 MG ER  capsule Take 1 capsule (300 mg total) by mouth at bedtime. Qty: 30 capsule, Refills: 3        If you experience worsening of your admission symptoms, develop shortness of breath, life threatening emergency, suicidal or homicidal thoughts you must seek medical attention immediately by calling 911 or calling your MD immediately  if symptoms less severe.  You Must read complete instructions/literature along with all the possible adverse reactions/side effects for all the Medicines you take and that have been prescribed to you. Take any new Medicines after you have completely understood and accept all the possible adverse reactions/side effects.   Please note  You were cared for by a hospitalist during your hospital stay. If you have any questions about your discharge medications or the care you received while you were in the hospital after you are discharged, you can call the unit and asked to speak with the hospitalist on call if the hospitalist that took care of you is not available. Once you are discharged, your primary care physician will handle any further medical issues. Please note that NO REFILLS for any discharge medications will be authorized once you are discharged, as it is imperative that you return to your primary care physician (or establish a relationship with a primary care physician if you do not have one) for your aftercare needs so that they can reassess your need for medications and monitor your lab values. Today   SUBJECTIVE   Doing well. No HA. Eating well No seizures reported  VITAL SIGNS:  Blood pressure 128/78, pulse 70, temperature 98.1 F (36.7 C), temperature source Oral, resp. rate 18, height  (1.88 m), weight 123.106  kg (271 lb 6.4 oz), SpO2 98 %.  I/O:   Intake/Output Summary (Last 24 hours) at 08/01/15 0704 Last data filed at 07/31/15 1700  Gross per 24 hour  Intake      0 ml  Output    550 ml  Net   -550 ml    PHYSICAL EXAMINATION:  GENERAL:  24  y.o.-year-old patient lying in the bed with no acute distress.  EYES: Pupils equal, round, reactive to light and accommodation. No scleral icterus. Extraocular muscles intact.  HEENT: Head atraumatic, normocephalic. Oropharynx and nasopharynx clear.  NECK:  Supple, no jugular venous distention. No thyroid enlargement, no tenderness.  LUNGS: Normal breath sounds bilaterally, no wheezing, rales,rhonchi or crepitation. No use of accessory muscles of respiration.  CARDIOVASCULAR: S1, S2 normal. No murmurs, rubs, or gallops.  ABDOMEN: Soft, non-tender, non-distended. Bowel sounds present. No organomegaly or mass.  EXTREMITIES: No pedal edema, cyanosis, or clubbing.  NEUROLOGIC: Cranial nerves II through XII are intact. Muscle strength 5/5 in all extremities. Sensation intact. Gait not checked.  PSYCHIATRIC: The patient is alert and oriented x 3.  SKIN: No obvious rash, lesion, or ulcer.   DATA REVIEW:   CBC   Recent Labs Lab 07/31/15 0028  WBC 12.9*  HGB 15.3  HCT 46.5  PLT 187    Chemistries   Recent Labs Lab 07/31/15 0028  NA 137  K 3.7  CL 108  CO2 21*  GLUCOSE 90  BUN 13  CREATININE 1.10  CALCIUM 8.9  AST 26  ALT 50  ALKPHOS 56  BILITOT 1.5*    Microbiology Results   Recent Results (from the past 240 hour(s))  Blood culture (routine x 2)     Status: None (Preliminary result)   Collection Time: 07/31/15 12:28 AM  Result Value Ref Range Status   Specimen Description BLOOD RIGHT ASSIST CONTROL  Final   Special Requests BOTTLES DRAWN AEROBIC AND ANAEROBIC 4CC  Final   Culture NO GROWTH < 12 HOURS  Final   Report Status PENDING  Incomplete  Blood culture (routine x 2)     Status: None (Preliminary result)   Collection Time: 07/31/15 12:28 AM  Result Value Ref Range Status   Specimen Description BLOOD LEFT ASSIST CONTROL  Final   Special Requests BOTTLES DRAWN AEROBIC AND ANAEROBIC 4CC  Final   Culture NO GROWTH < 12 HOURS  Final   Report Status PENDING   Incomplete    RADIOLOGY:  Ct Head Wo Contrast  07/31/2015  CLINICAL DATA:  Fever, headache, and seizure tonight. Head and neck pain now. History of seizures. Smoker. EXAM: CT HEAD WITHOUT CONTRAST CT CERVICAL SPINE WITHOUT CONTRAST TECHNIQUE: Multidetector CT imaging of the head and cervical spine was performed following the standard protocol without intravenous contrast. Multiplanar CT image reconstructions of the cervical spine were also generated. COMPARISON:  CT head 04/30/2011 FINDINGS: CT HEAD FINDINGS Ventricles and sulci appear symmetrical. No ventricular dilatation. No mass effect or midline shift. No abnormal extra-axial fluid collections. Gray-white matter junctions are distinct. Basal cisterns are not effaced. No evidence of acute intracranial hemorrhage. No depressed skull fractures. Visualized paranasal sinuses and mastoid air cells are not opacified. CT CERVICAL SPINE FINDINGS Straightening of the usual cervical lordosis. This may be due to patient positioning but ligamentous injury or muscle spasm could also have this appearance and is not excluded. No anterior subluxation. Normal alignment of the posterior elements. No vertebral compression deformities. Intervertebral disc space heights are preserved. No prevertebral soft tissue swelling.  C1-2 articulation appears intact. No focal bone lesion or bone destruction. Bone cortex and trabecular architecture appear intact. IMPRESSION: No acute intracranial abnormalities. Nonspecific straightening of usual cervical lordosis. No acute displaced fractures identified. Electronically Signed   By: Burman Nieves M.D.   On: 07/31/2015 00:16   Ct Cervical Spine Wo Contrast  07/31/2015  CLINICAL DATA:  Fever, headache, and seizure tonight. Head and neck pain now. History of seizures. Smoker. EXAM: CT HEAD WITHOUT CONTRAST CT CERVICAL SPINE WITHOUT CONTRAST TECHNIQUE: Multidetector CT imaging of the head and cervical spine was performed following the  standard protocol without intravenous contrast. Multiplanar CT image reconstructions of the cervical spine were also generated. COMPARISON:  CT head 04/30/2011 FINDINGS: CT HEAD FINDINGS Ventricles and sulci appear symmetrical. No ventricular dilatation. No mass effect or midline shift. No abnormal extra-axial fluid collections. Gray-white matter junctions are distinct. Basal cisterns are not effaced. No evidence of acute intracranial hemorrhage. No depressed skull fractures. Visualized paranasal sinuses and mastoid air cells are not opacified. CT CERVICAL SPINE FINDINGS Straightening of the usual cervical lordosis. This may be due to patient positioning but ligamentous injury or muscle spasm could also have this appearance and is not excluded. No anterior subluxation. Normal alignment of the posterior elements. No vertebral compression deformities. Intervertebral disc space heights are preserved. No prevertebral soft tissue swelling. C1-2 articulation appears intact. No focal bone lesion or bone destruction. Bone cortex and trabecular architecture appear intact. IMPRESSION: No acute intracranial abnormalities. Nonspecific straightening of usual cervical lordosis. No acute displaced fractures identified. Electronically Signed   By: Burman Nieves M.D.   On: 07/31/2015 00:16     Management plans discussed with the patient, family and they are in agreement.  CODE STATUS:     Code Status Orders        Start     Ordered   07/31/15 0319  Full code   Continuous     07/31/15 0318      TOTAL TIME TAKING CARE OF THIS PATIENT: 40 minutes.    Lashannon Bresnan M.D on 08/01/2015 at 7:04 AM  Between 7am to 6pm - Pager - [phone] After 6pm go to [email or web address] - password [email or web address] [facility]  [place] [facility]  Office  [phone]  CC: Primary care physician; No PCP Per Patient

## 2015-08-01 NOTE — Progress Notes (Signed)
Pt being discharged, instructions reviewed with pt, he verbalized understanding. Pt rolled out by staff in wheelchair.

## 2015-08-01 NOTE — Discharge Instructions (Signed)
Pt advised not to drive till cleaerd by his PCP or Neurologist

## 2015-08-04 ENCOUNTER — Ambulatory Visit: Payer: Self-pay

## 2015-08-05 LAB — CULTURE, BLOOD (ROUTINE X 2)
CULTURE: NO GROWTH
Culture: NO GROWTH

## 2015-10-08 ENCOUNTER — Emergency Department
Admission: EM | Admit: 2015-10-08 | Discharge: 2015-10-08 | Disposition: A | Discharge status: Home / Self Care | Payer: MEDICAID | Attending: Student | Admitting: Student

## 2015-10-08 ENCOUNTER — Emergency Department: Payer: MEDICAID

## 2015-10-08 ENCOUNTER — Encounter: Payer: Self-pay | Admitting: Emergency Medicine

## 2015-10-08 DIAGNOSIS — F17210 Nicotine dependence, cigarettes, uncomplicated: Secondary | ICD-10-CM | POA: Insufficient documentation

## 2015-10-08 DIAGNOSIS — J069 Acute upper respiratory infection, unspecified: Secondary | ICD-10-CM

## 2015-10-08 DIAGNOSIS — Z79899 Other long term (current) drug therapy: Secondary | ICD-10-CM | POA: Insufficient documentation

## 2015-10-08 MED ORDER — GUAIFENESIN-CODEINE 100-10 MG/5ML PO SOLN: 10.0000 mL | ORAL | Status: DC | PRN

## 2015-10-08 MED ORDER — AZITHROMYCIN 250 MG PO TABS: ORAL_TABLET | ORAL | Status: DC

## 2015-10-08 MED ORDER — IBUPROFEN 800 MG PO TABS: 800.0000 mg | ORAL_TABLET | Freq: Three times a day (TID) | ORAL | Status: DC | PRN

## 2015-10-08 MED ORDER — CHLORPHENIRAMINE MALEATE 4 MG PO TABS: 4.0000 mg | ORAL_TABLET | Freq: Two times a day (BID) | ORAL | Status: DC | PRN

## 2015-10-08 NOTE — ED Notes (Signed)
Coughing, sneezing, poss. Fever for a few days.

## 2015-10-08 NOTE — ED Notes (Signed)
Pt to ed with c/o cough, congestion, body aches, fever x 3 days.  Pt states last use of tylenol about 45 min pta.,

## 2015-10-08 NOTE — ED Provider Notes (Signed)
[facility] Emergency Department Provider Note  ____________________________________________  Time seen: Approximately 12:50 PM  I have reviewed the triage vital signs and the nursing notes.   HISTORY  Chief Complaint Cough    HPI Ricky Alvarado is a 25 y.o. male who presents with three-day history of cough congestion and body aches and fever. Patient states his throat hurts secondary to coughing so much. Patient states that in addition to this is been sneezing. No relief with any over-the-counter medication. Symptoms are worse when he started taking deep breaths or lying flat. Denies any recent surgery or medication changes. Activities decreased significantly since the onset of symptoms.   Past Medical History  Diagnosis Date  . Seizures (HCC)   . Migraine     Patient Active Problem List   Diagnosis Date Noted  . Meningitis 07/31/2015    Past Surgical History  Procedure Laterality Date  . None      Current Outpatient Rx  Name  Route  Sig  Dispense  Refill  . azithromycin (ZITHROMAX Z-PAK) 250 MG tablet      Take 2 tablets (500 mg) on  Day 1,  followed by 1 tablet (250 mg) once daily on Days 2 through 5.   6 each   0   . chlorpheniramine (CHLOR-TRIMETON) 4 MG tablet   Oral   Take 1 tablet (4 mg total) by mouth 2 (two) times daily as needed for allergies or rhinitis.   30 tablet   0   . guaiFENesin-codeine 100-10 MG/5ML syrup   Oral   Take 10 mLs by mouth every 4 (four) hours as needed for cough.   180 mL   0   . ibuprofen (ADVIL,MOTRIN) 800 MG tablet   Oral   Take 1 tablet (800 mg total) by mouth every 8 (eight) hours as needed.   30 tablet   0   . phenytoin (DILANTIN) 300 MG ER capsule   Oral   Take 1 capsule (300 mg total) by mouth at bedtime.   30 capsule   3     Allergies Review of patient's allergies indicates no known allergies.  Family History  Problem Relation Age of Onset  . Diabetes Mellitus II Mother      Social History Social History  Substance Use Topics  . Smoking status: Current Some Day Smoker -- 0.50 packs/day    Types: Cigarettes  . Smokeless tobacco: Never Used  . Alcohol Use: No    Review of Systems Constitutional: Positive fever/chills Eyes: No visual changes. ENT: Mild sore throat. Cardiovascular: Denies chest pain. Respiratory: Positive for productive cough with occasional shortness of breath. Gastrointestinal: No abdominal pain.  No nausea, no vomiting.  No diarrhea.  No constipation. Genitourinary: Negative for dysuria. Musculoskeletal: Negative for back pain. Positive for generalized body aches. Skin: Negative for rash. Neurological: Positive for headaches, denies focal weakness or numbness.  10-point ROS otherwise negative.  ____________________________________________   PHYSICAL EXAM:  VITAL SIGNS: ED Triage Vitals  Enc Vitals Group     BP 10/08/15 1208 116/67 mmHg     Pulse Rate 10/08/15 1205 80     Resp 10/08/15 1205 18     Temp 10/08/15 1205 98.6 F (37 C)     Temp Source 10/08/15 1205 Oral     SpO2 10/08/15 1205 97 %     Weight 10/08/15 1205 255 lb (115.667 kg)     Height 10/08/15 1205  (1.88 m)     Head Cir --  Peak Flow --      Pain Score 10/08/15 1206 7     Pain Loc --      Pain Edu? --      Excl. in GC? --     Constitutional: Alert and oriented. Well appearing and in no acute distress. Head: Atraumatic. Nose: Positive congestion/rhinnorhea. Mouth/Throat: Mucous membranes are moist.  Oropharynx non-erythematous. Neck: No stridor.   Cardiovascular: Normal rate, regular rhythm. Grossly normal heart sounds.  Good peripheral circulation. Respiratory: Normal respiratory effort.  No retractions. Lungs scattered coarse breath sounds noted. Musculoskeletal: No lower extremity tenderness nor edema.  No joint effusions. Neurologic:  Normal speech and language. No gross focal neurologic deficits are appreciated. No gait  instability. Skin:  Skin is warm, dry and intact. No rash noted. Psychiatric: Mood and affect are normal. Speech and behavior are normal.  ____________________________________________   LABS (all labs ordered are listed, but only abnormal results are displayed)  Labs Reviewed - No data to display ____________________________________________  RADIOLOGY  Negative for atelectasis or pneumonia. No acute cardiopulmonary process noted. ____________________________________________   PROCEDURES  Procedure(s) performed: None  Critical Care performed: No  ____________________________________________   INITIAL IMPRESSION / ASSESSMENT AND PLAN / ED COURSE  Pertinent labs & imaging results that were available during my care of the patient were reviewed by me and considered in my medical decision making (see chart for details).  Acute upper respiratory infection. Rx given for Z-Pak, Robitussin-AC, Motrin 800 mg 3 times a day. Work excuse 24 hours. Patient follow-up with PCP or return to the ER with any worsening symptomology.  Patient voices no other emergency medical complaints at this time. ____________________________________________   FINAL CLINICAL IMPRESSION(S) / ED DIAGNOSES  Final diagnoses:  URI, acute     This chart was dictated using voice recognition software/Dragon. Despite best efforts to proofread, errors can occur which can change the meaning. Any change was purely unintentional.   Evangeline Dakin, PA-C 10/08/15 1331  Gayla Doss, MD 10/08/15 [phone]

## 2015-10-08 NOTE — Discharge Instructions (Signed)
Upper Respiratory Infection, Adult Most upper respiratory infections (URIs) are a viral infection of the air passages leading to the lungs. A URI affects the nose, throat, and upper air passages. The most common type of URI is nasopharyngitis and is typically referred to as "the common cold." URIs run their course and usually go away on their own. Most of the time, a URI does not require medical attention, but sometimes a bacterial infection in the upper airways can follow a viral infection. This is called a secondary infection. Sinus and middle ear infections are common types of secondary upper respiratory infections. Bacterial pneumonia can also complicate a URI. A URI can worsen asthma and chronic obstructive pulmonary disease (COPD). Sometimes, these complications can require emergency medical care and may be life threatening.  CAUSES Almost all URIs are caused by viruses. A virus is a type of germ and can spread from one person to another.  RISKS FACTORS You may be at risk for a URI if:   You smoke.   You have chronic heart or lung disease.  You have a weakened defense (immune) system.   You are very young or very old.   You have nasal allergies or asthma.  You work in crowded or poorly ventilated areas.  You work in health care facilities or schools. SIGNS AND SYMPTOMS  Symptoms typically develop 2-3 days after you come in contact with a cold virus. Most viral URIs last 7-10 days. However, viral URIs from the influenza virus (flu virus) can last 14-18 days and are typically more severe. Symptoms may include:   Runny or stuffy (congested) nose.   Sneezing.   Cough.   Sore throat.   Headache.   Fatigue.   Fever.   Loss of appetite.   Pain in your forehead, behind your eyes, and over your cheekbones (sinus pain).  Muscle aches.  DIAGNOSIS  Your health care provider may diagnose a URI by:  Physical exam.  Tests to check that your symptoms are not due to  another condition such as:  Strep throat.  Sinusitis.  Pneumonia.  Asthma. TREATMENT  A URI goes away on its own with time. It cannot be cured with medicines, but medicines may be prescribed or recommended to relieve symptoms. Medicines may help:  Reduce your fever.  Reduce your cough.  Relieve nasal congestion. HOME CARE INSTRUCTIONS   Take medicines only as directed by your health care provider.   Gargle warm saltwater or take cough drops to comfort your throat as directed by your health care provider.  Use a warm mist humidifier or inhale steam from a shower to increase air moisture. This may make it easier to breathe.  Drink enough fluid to keep your urine clear or pale yellow.   Eat soups and other clear broths and maintain good nutrition.   Rest as needed.   Return to work when your temperature has returned to normal or as your health care provider advises. You may need to stay home longer to avoid infecting others. You can also use a face mask and careful hand washing to prevent spread of the virus.  Increase the usage of your inhaler if you have asthma.   Do not use any tobacco products, including cigarettes, chewing tobacco, or electronic cigarettes. If you need help quitting, ask your health care provider. PREVENTION  The best way to protect yourself from getting a cold is to practice good hygiene.   Avoid oral or hand contact with people with cold   symptoms.   Wash your hands often if contact occurs.  There is no clear evidence that vitamin C, vitamin E, echinacea, or exercise reduces the chance of developing a cold. However, it is always recommended to get plenty of rest, exercise, and practice good nutrition.  SEEK MEDICAL CARE IF:   You are getting worse rather than better.   Your symptoms are not controlled by medicine.   You have chills.  You have worsening shortness of breath.  You have brown or red mucus.  You have yellow or brown nasal  discharge.  You have pain in your face, especially when you bend forward.  You have a fever.  You have swollen neck glands.  You have pain while swallowing.  You have white areas in the back of your throat. SEEK IMMEDIATE MEDICAL CARE IF:   You have severe or persistent:  Headache.  Ear pain.  Sinus pain.  Chest pain.  You have chronic lung disease and any of the following:  Wheezing.  Prolonged cough.  Coughing up blood.  A change in your usual mucus.  You have a stiff neck.  You have changes in your:  Vision.  Hearing.  Thinking.  Mood. MAKE SURE YOU:   Understand these instructions.  Will watch your condition.  Will get help right away if you are not doing well or get worse.   This information is not intended to replace advice given to you by your health care provider. Make sure you discuss any questions you have with your health care provider.   Document Released: 01/23/2001 Document Revised: 12/14/2014 Document Reviewed: 11/04/2013 Elsevier Interactive Patient Education 2016 Elsevier Inc.  

## 2015-10-08 NOTE — ED Notes (Signed)
Headache, body aches as well.

## 2016-02-22 ENCOUNTER — Emergency Department
Admission: EM | Admit: 2016-02-22 | Discharge: 2016-02-22 | Disposition: A | Discharge status: Home / Self Care | Payer: MEDICAID

## 2016-02-22 ENCOUNTER — Encounter: Payer: Self-pay | Admitting: *Deleted

## 2016-02-22 DIAGNOSIS — Z8669 Personal history of other diseases of the nervous system and sense organs: Secondary | ICD-10-CM | POA: Insufficient documentation

## 2016-02-22 DIAGNOSIS — K6289 Other specified diseases of anus and rectum: Secondary | ICD-10-CM | POA: Insufficient documentation

## 2016-02-22 DIAGNOSIS — Z79899 Other long term (current) drug therapy: Secondary | ICD-10-CM | POA: Insufficient documentation

## 2016-02-22 DIAGNOSIS — Z792 Long term (current) use of antibiotics: Secondary | ICD-10-CM | POA: Insufficient documentation

## 2016-02-22 DIAGNOSIS — F17210 Nicotine dependence, cigarettes, uncomplicated: Secondary | ICD-10-CM | POA: Insufficient documentation

## 2016-02-22 NOTE — ED Notes (Signed)
Pt to ED with hemorids.Pt states been going on for 2 weeks, been using cream with no relief. Vitals stable, NAD noted at this time. Pain 7/10.

## 2016-02-23 ENCOUNTER — Emergency Department
Admission: EM | Admit: 2016-02-23 | Discharge: 2016-02-23 | Disposition: A | Discharge status: Home / Self Care | Payer: MEDICAID | Attending: Emergency Medicine | Admitting: Emergency Medicine

## 2016-02-23 DIAGNOSIS — K6289 Other specified diseases of anus and rectum: Secondary | ICD-10-CM

## 2016-02-23 MED ORDER — DOCUSATE SODIUM 100 MG PO CAPS: ORAL_CAPSULE | ORAL | Status: DC

## 2016-02-23 MED ORDER — HYDROCORTISONE ACE-PRAMOXINE 1-1 % RE FOAM: 1.0000 | Freq: Two times a day (BID) | RECTAL | Status: DC

## 2016-02-23 NOTE — ED Notes (Signed)
Pt discharged to home.  Discharge instructions reviewed.  Verbalized understanding.  No questions or concerns at this time.  Teach back verified.  Pt in NAD.  No items left in ED.   

## 2016-02-23 NOTE — ED Provider Notes (Signed)
[facility] Emergency Department Provider Note  ____________________________________________  Time seen: Approximately 1:30 AM  I have reviewed the triage vital signs and the nursing notes.   HISTORY  Chief Complaint Hemorrhoids    HPI Ricky Alvarado is a 25 y.o. male with a history of obesity and tobacco use but otherwise good health who presents for evaluation of anal pain.  He reports that it has been gradual in onset over the last 2 weeks getting steadily worse.  It is worse when he tries to have a bowel movement and it started after a period where he was constipated and had to force out his stool.  He describes it as being at the "bottom part" of the anus closest to the scrotum.  He says he can feel a hard lump.  He denies fever/chills, chest pain, shortness of breath, nausea, vomiting, diarrhea.  His stools are more regular now but trying to have a bowel movement makes the pain worse.  He can find no position of comfort.  He has tried soaking in warm water and try Preparation H ointment but it does not help.  He describes the pain as severe and sharp.   Past Medical History  Diagnosis Date  . Seizures (HCC)   . Migraine     Patient Active Problem List   Diagnosis Date Noted  . Meningitis 07/31/2015    Past Surgical History  Procedure Laterality Date  . None      Current Outpatient Rx  Name  Route  Sig  Dispense  Refill  . azithromycin (ZITHROMAX Z-PAK) 250 MG tablet      Take 2 tablets (500 mg) on  Day 1,  followed by 1 tablet (250 mg) once daily on Days 2 through 5.   6 each   0   . chlorpheniramine (CHLOR-TRIMETON) 4 MG tablet   Oral   Take 1 tablet (4 mg total) by mouth 2 (two) times daily as needed for allergies or rhinitis.   30 tablet   0   . docusate sodium (COLACE) 100 MG capsule      Take 1 tablet once or twice daily as needed for constipation.   30 capsule   0   . guaiFENesin-codeine 100-10 MG/5ML syrup   Oral  Take 10 mLs by mouth every 4 (four) hours as needed for cough.   180 mL   0   . hydrocortisone-pramoxine (PROCTOFOAM HC) rectal foam   Rectal   Place 1 applicator rectally 2 (two) times daily.   10 g   0   . ibuprofen (ADVIL,MOTRIN) 800 MG tablet   Oral   Take 1 tablet (800 mg total) by mouth every 8 (eight) hours as needed.   30 tablet   0   . phenytoin (DILANTIN) 300 MG ER capsule   Oral   Take 1 capsule (300 mg total) by mouth at bedtime.   30 capsule   3     Allergies Review of patient's allergies indicates no known allergies.  Family History  Problem Relation Age of Onset  . Diabetes Mellitus II Mother     Social History Social History  Substance Use Topics  . Smoking status: Current Some Day Smoker -- 0.50 packs/day    Types: Cigarettes  . Smokeless tobacco: Never Used  . Alcohol Use: No    Review of Systems Constitutional: No fever/chills Eyes: No visual changes. ENT: No sore throat. Cardiovascular: Denies chest pain. Respiratory: Denies shortness of breath. Gastrointestinal:  No abdominal pain.  No nausea, no vomiting.  No diarrhea.  No constipation. Intense anal/rectal pain Genitourinary: Negative for dysuria. Musculoskeletal: Negative for back pain. Skin: Negative for rash. Neurological: Negative for headaches, focal weakness or numbness.  10-point ROS otherwise negative.  ____________________________________________   PHYSICAL EXAM:  VITAL SIGNS: ED Triage Vitals  Enc Vitals Group     BP 02/22/16 2306 141/85 mmHg     Pulse Rate 02/22/16 2306 70     Resp 02/22/16 2306 18     Temp 02/22/16 2306 98.7 F (37.1 C)     Temp Source 02/22/16 2306 Oral     SpO2 02/22/16 2306 97 %     Weight 02/22/16 2306 255 lb (115.667 kg)     Height 02/22/16 2306 6\' 1"  (1.854 m)     Head Cir --      Peak Flow --      Pain Score 02/22/16 2306 7     Pain Loc --      Pain Edu? --      Excl. in GC? --     Constitutional: Alert and oriented. Well appearing  and in no acute distress. Eyes: Conjunctivae are normal. PERRL. EOMI. Head: Atraumatic. Nose: No congestion/rhinnorhea. Mouth/Throat: Mucous membranes are moist.  Oropharynx non-erythematous. Neck: No stridor.  No meningeal signs.   Cardiovascular: Normal rate, regular rhythm. Good peripheral circulation. Grossly normal heart sounds.   Respiratory: Normal respiratory effort.  No retractions. Lungs CTAB. Gastrointestinal: Soft and nontender. No distention.  Rectal:  Essentially normal external exam with some tenderness to palpation of the bottom of the anus (closest to the scrotum).  No obvious tear at this time but I am suspicious for anal fissure.  There is no obvious external hemorroid, thrombosed or otherwise. Musculoskeletal: No lower extremity tenderness nor edema. No gross deformities of extremities. Neurologic:  Normal speech and language. No gross focal neurologic deficits are appreciated.  Skin:  Skin is warm, dry and intact. No rash noted. Psychiatric: Mood and affect are normal. Speech and behavior are normal.  ____________________________________________   LABS (all labs ordered are listed, but only abnormal results are displayed)  Labs Reviewed - No data to display ____________________________________________  EKG  None ____________________________________________  RADIOLOGY   No results found.  ____________________________________________   PROCEDURES  Procedure(s) performed:   Procedures   ____________________________________________   INITIAL IMPRESSION / ASSESSMENT AND PLAN / ED COURSE  Pertinent labs & imaging results that were available during my care of the patient were reviewed by me and considered in my medical decision making (see chart for details).  I have no concerns or suspicion for perianal or perirectal abscess; the patient is hemodynamically stable and there is no induration or redness or swelling around the anus.  I suspect either a  hemorrhoid although there is not an obvious 1 visible or possibly more likely an anal fissure.  The patient would not easily tolerate an anus scope at this time.  I had a long discussion with him about conservative measurements to manage his discomfort and provided the name and number for a surgical follow-up.  I gave my usual and customary return precautions.      ____________________________________________  FINAL CLINICAL IMPRESSION(S) / ED DIAGNOSES  Final diagnoses:  Anal pain     MEDICATIONS GIVEN DURING THIS VISIT:  Medications - No data to display   NEW OUTPATIENT MEDICATIONS STARTED DURING THIS VISIT:  New Prescriptions   DOCUSATE SODIUM (COLACE) 100 MG CAPSULE  Take 1 tablet once or twice daily as needed for constipation.   HYDROCORTISONE-PRAMOXINE (PROCTOFOAM HC) RECTAL FOAM    Place 1 applicator rectally 2 (two) times daily.      Note:  This document was prepared using Dragon voice recognition software and may include unintentional dictation errors.   Loleta Rose, MD 02/23/16 [phone]

## 2016-02-23 NOTE — Discharge Instructions (Signed)
As we discussed, there is no evidence of infection at this time, and it does not appear you have a thrombosed hemorrhoid.  You may have a small anal fissure.  We encourage you to use the over-the-counter medications such as Preparation H or topical tetracaine as well as a barrier ointment such as Desitin.  We are also providing a prescription for a medication called Proctofoam which may help with her discomfort.  Please read through all the included information about both anal fissures and hemorrhoids and follow-up with the surgeon listed at the next available opportunity for further evaluation.  Return to the emergency department if you develop new or worsening symptoms that concern you.   Anal Fissure, Adult An anal fissure is a small tear or crack in the skin around the anus. Bleeding from a fissure usually stops on its own within a few minutes. However, bleeding will often occur again with each bowel movement until the crack heals. CAUSES This condition may be caused by:  Passing large, hard stool (feces).  Frequent diarrhea.  Constipation.  Inflammatory bowel disease (Crohn disease or ulcerative colitis).  Infections.  Anal sex. SYMPTOMS Symptoms of this condition include:  Bleeding from the rectum.  Small amounts of blood seen on your stool, on toilet paper, or in the toilet after a bowel movement.  Painful bowel movements.  Itching or irritation around the anus. DIAGNOSIS A health care provider may diagnose this condition by closely examining the anal area. An anal fissure can usually be seen with careful inspection. In some cases, a rectal exam may be performed, or a short tube (anoscope) may be used to examine the anal canal. TREATMENT Treatment for this condition may include:  Taking steps to avoid constipation. This may include making changes to your diet, such as increasing your intake of fiber or fluid.  Taking fiber supplements. These supplements can soften your  stool to help make bowel movements easier. Your health care provider may also prescribe a stool softener if your stool is often hard.  Taking sitz baths. This may help to heal the tear.  Using medicated creams or ointments. These may be prescribed to lessen discomfort. HOME CARE INSTRUCTIONS Eating and Drinking  Avoid foods that may be constipating, such as bananas and dairy products.  Drink enough fluid to keep your urine clear or pale yellow.  Maintain a diet that is high in fruits, whole grains, and vegetables. General Instructions  Keep the anal area as clean and dry as possible.  Take sitz baths as told by your health care provider. Do not use soap in the sitz baths.  Take over-the-counter and prescription medicines only as told by your health care provider.  Use creams or ointments only as told by your health care provider.  Keep all follow-up visits as told by your health care provider. This is important. SEEK MEDICAL CARE IF:  You have more bleeding.  You have a fever.  You have diarrhea that is mixed with blood.  You continue to have pain.  Your problem is getting worse rather than better.   This information is not intended to replace advice given to you by your health care provider. Make sure you discuss any questions you have with your health care provider.   Document Released: 07/30/2005 Document Revised: 04/20/2015 Document Reviewed: 10/25/2014 Elsevier Interactive Patient Education [employer]2016 Elsevier Inc.

## 2016-02-29 ENCOUNTER — Ambulatory Visit: Payer: Self-pay | Admitting: General Surgery

## 2016-04-10 ENCOUNTER — Emergency Department
Admission: EM | Admit: 2016-04-10 | Discharge: 2016-04-10 | Disposition: A | Discharge status: Home / Self Care | Payer: Self-pay | Attending: Emergency Medicine | Admitting: Emergency Medicine

## 2016-04-10 ENCOUNTER — Encounter: Payer: Self-pay | Admitting: Emergency Medicine

## 2016-04-10 ENCOUNTER — Emergency Department: Payer: Self-pay

## 2016-04-10 DIAGNOSIS — R319 Hematuria, unspecified: Secondary | ICD-10-CM

## 2016-04-10 DIAGNOSIS — X58XXXA Exposure to other specified factors, initial encounter: Secondary | ICD-10-CM | POA: Insufficient documentation

## 2016-04-10 DIAGNOSIS — Y999 Unspecified external cause status: Secondary | ICD-10-CM | POA: Insufficient documentation

## 2016-04-10 DIAGNOSIS — S39012A Strain of muscle, fascia and tendon of lower back, initial encounter: Secondary | ICD-10-CM

## 2016-04-10 DIAGNOSIS — Z7982 Long term (current) use of aspirin: Secondary | ICD-10-CM | POA: Insufficient documentation

## 2016-04-10 DIAGNOSIS — Y929 Unspecified place or not applicable: Secondary | ICD-10-CM | POA: Insufficient documentation

## 2016-04-10 DIAGNOSIS — Y939 Activity, unspecified: Secondary | ICD-10-CM | POA: Insufficient documentation

## 2016-04-10 DIAGNOSIS — F17210 Nicotine dependence, cigarettes, uncomplicated: Secondary | ICD-10-CM | POA: Insufficient documentation

## 2016-04-10 LAB — CBC
HCT: 47.2 % (ref 40.0–52.0)
HEMOGLOBIN: 16.3 g/dL (ref 13.0–18.0)
MCH: 33 pg (ref 26.0–34.0)
MCHC: 34.6 g/dL (ref 32.0–36.0)
MCV: 95.3 fL (ref 80.0–100.0)
PLATELETS: 205 10*3/uL (ref 150–440)
RBC: 4.95 MIL/uL (ref 4.40–5.90)
RDW: 13.1 % (ref 11.5–14.5)
WBC: 10.6 10*3/uL (ref 3.8–10.6)

## 2016-04-10 LAB — COMPREHENSIVE METABOLIC PANEL
ALBUMIN: 4.7 g/dL (ref 3.5–5.0)
ALT: 57 U/L (ref 17–63)
AST: 47 U/L — ABNORMAL HIGH (ref 15–41)
Alkaline Phosphatase: 72 U/L (ref 38–126)
Anion gap: 9 (ref 5–15)
BUN: 13 mg/dL (ref 6–20)
CHLORIDE: 104 mmol/L (ref 101–111)
CO2: 24 mmol/L (ref 22–32)
CREATININE: 1.12 mg/dL (ref 0.61–1.24)
Calcium: 9.5 mg/dL (ref 8.9–10.3)
GFR calc non Af Amer: >60 mL/min (ref >60)
GLUCOSE: 101 mg/dL — AB (ref 65–99)
Potassium: 3.9 mmol/L (ref 3.5–5.1)
SODIUM: 137 mmol/L (ref 135–145)
Total Bilirubin: 0.7 mg/dL (ref 0.3–1.2)
Total Protein: 7.4 g/dL (ref 6.5–8.1)

## 2016-04-10 LAB — URINALYSIS COMPLETE WITH MICROSCOPIC (ARMC ONLY)
Bilirubin Urine: NEGATIVE
Glucose, UA: NEGATIVE mg/dL
Ketones, ur: NEGATIVE mg/dL
Leukocytes, UA: NEGATIVE
Nitrite: NEGATIVE
PH: 5 (ref 5.0–8.0)
PROTEIN: NEGATIVE mg/dL
Specific Gravity, Urine: 1.027 (ref 1.005–1.030)

## 2016-04-10 LAB — LIPASE, BLOOD: LIPASE: 71 U/L — AB (ref 11–51)

## 2016-04-10 MED ORDER — TRAMADOL HCL 50 MG PO TABS: 50.0000 mg | ORAL_TABLET | Freq: Four times a day (QID) | ORAL | 0 refills | Status: AC | PRN

## 2016-04-10 MED ORDER — ONDANSETRON 4 MG PO TBDP
4.0000 mg | ORAL_TABLET | Freq: Once | ORAL | Status: AC | PRN
  Administered 2016-04-10: 4 mg via ORAL
  Filled 2016-04-10: qty 1

## 2016-04-10 NOTE — ED Notes (Signed)
Patient transported to CT 

## 2016-04-10 NOTE — ED Triage Notes (Signed)
Pt ambulatory to triage with steady gait with c/o mid back pain since yesterday, states left abdominal pain since this morning. Pt reports increased urinary frequency and increased back pain when taking deep breath. Pt denies hematuria or dysuria.

## 2016-04-10 NOTE — ED Provider Notes (Signed)
[facility] Emergency Department Provider Note  ____________________________________________   I have reviewed the triage vital signs and the nursing notes.   HISTORY  Chief Complaint Back Pain and Abdominal Pain    HPI Ricky Alvarado is a 25 y.o. male who presents today complaining of left flank pain radiating to his groin which began suddenly at 2:00 yesterday. Denies any nausea or vomiting at this time. Did have vomiting before. Denies chest pain or shortness of breath. States it hurts when he moves or changes position. Has not had any trauma. No numbness or weakness. No midline back pain. No recollected injury or fall. No history of kidney stones. Patient has no testicular pain or swelling. He denies dysuria or urinary frequency. Nothing makes the pain better, moving makes the pain worse.      Past Medical History:  Diagnosis Date  . Migraine   . Seizures [facility] [place])     Patient Active Problem List   Diagnosis Date Noted  . 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 none      Prior to Admission medications   Medication Sig Start Date End Date Taking? Authorizing Provider  Aspirin-Caffeine (BAYER BACK & BODY) 500-32.5 MG TABS Take 2 tablets by mouth.   Yes Historical Provider, MD  azithromycin (ZITHROMAX Z-PAK) 250 MG tablet Take 2 tablets (500 mg) on  Day 1,  followed by 1 tablet (250 mg) once daily on Days 2 through 5. Patient not taking: Reported on 04/10/2016 10/08/15   Charmayne Sheer Beers, PA-C  chlorpheniramine (CHLOR-TRIMETON) 4 MG tablet Take 1 tablet (4 mg total) by mouth 2 (two) times daily as needed for allergies or rhinitis. Patient not taking: Reported on 04/10/2016 10/08/15   Charmayne Sheer Beers, PA-C  docusate sodium (COLACE) 100 MG capsule Take 1 tablet once or twice daily as needed for constipation. Patient not taking: Reported on 04/10/2016 02/23/16   Loleta Rose, MD  guaiFENesin-codeine 100-10 MG/5ML syrup  Take 10 mLs by mouth every 4 (four) hours as needed for cough. Patient not taking: Reported on 04/10/2016 10/08/15   Charmayne Sheer Beers, PA-C  hydrocortisone-pramoxine (PROCTOFOAM [facility]) rectal foam Place 1 applicator rectally 2 (two) times daily. Patient not taking: Reported on 04/10/2016 02/23/16   Loleta Rose, MD  ibuprofen (ADVIL,MOTRIN) 800 MG tablet Take 1 tablet (800 mg total) by mouth every 8 (eight) hours as needed. Patient not taking: Reported on 04/10/2016 10/08/15   Evangeline Dakin, PA-C  phenytoin (DILANTIN) 300 MG ER capsule Take 1 capsule (300 mg total) by mouth at bedtime. Patient not taking: Reported on 04/10/2016 08/01/15   Enedina Finner, MD    Allergies Review of patient's allergies indicates no known allergies.  Family History  Problem Relation Age of Onset  . Diabetes Mellitus II Mother     Social History Social History  Substance Use Topics  . Smoking status: Current Some Day Smoker    Packs/day: 0.50    Types: Cigarettes  . Smokeless tobacco: Never Used  . Alcohol use No    Review of Systems Constitutional: No fever/chills Eyes: No visual changes. ENT: No sore throat. No stiff neck no neck pain Cardiovascular: Denies chest pain. Respiratory: Denies shortness of breath. Gastrointestinal:   no vomiting.  No diarrhea.  No constipation. Genitourinary: Negative for dysuria. Musculoskeletal: Negative lower extremity swelling Skin: Negative for rash. Neurological: Negative for severe headaches, focal weakness or numbness. 10-point ROS otherwise negative.  ____________________________________________   PHYSICAL EXAM:  VITAL SIGNS: ED Triage Vitals  Enc Vitals Group     BP 04/10/16 0406 (!) 147/94     Pulse Rate 04/10/16 0406 61     Resp 04/10/16 0406 18     Temp 04/10/16 0406 98.2 F (36.8 C)     Temp Source 04/10/16 0406 Oral     SpO2 04/10/16 0406 100 %     Weight 04/10/16 0407 251 lb (113.9 kg)     Height 04/10/16 0407 6\' 1"  (1.854 m)     Head  Circumference --      Peak Flow --      Pain Score 04/10/16 0407 6     Pain Loc --      Pain Edu? --      Excl. in GC? --     Constitutional: Alert and oriented. Well appearing and in no acute distress. Eyes: Conjunctivae are normal. PERRL. EOMI. Head: Atraumatic. Nose: No congestion/rhinnorhea. Mouth/Throat: Mucous membranes are moist.  Oropharynx non-erythematous. Neck: No stridor.   Nontender with no meningismus Cardiovascular: Normal rate, regular rhythm. Grossly normal heart sounds.  Good peripheral circulation. Respiratory: Normal respiratory effort.  No retractions. Lungs CTAB. Abdominal: Soft and mental to palpation of the left lower quadrant. No distention. No guarding no rebound Back:  She has some paraspinal muscle tenderness in the lower left back around T10-T12.  there is no midline tenderness there are no lesions noted. there is positive left CVA tenderness GU: Normal external male genitalia. Musculoskeletal: No lower extremity tenderness, no upper extremity tenderness. No joint effusions, no DVT signs strong distal pulses no edema Neurologic:  Normal speech and language. No gross focal neurologic deficits are appreciated.  Skin:  Skin is warm, dry and intact. No rash noted. Psychiatric: Mood and affect are normal. Speech and behavior are normal.  ____________________________________________   LABS (all labs ordered are listed, but only abnormal results are displayed)  Labs Reviewed  LIPASE, BLOOD - Abnormal; Notable for the following:       Result Value   Lipase 71 (*)    All other components within normal limits  COMPREHENSIVE METABOLIC PANEL - Abnormal; Notable for the following:    Glucose, Bld 101 (*)    AST 47 (*)    All other components within normal limits  URINALYSIS COMPLETEWITH MICROSCOPIC ([facility] ONLY) - Abnormal; Notable for the following:    Color, Urine YELLOW (*)    APPearance CLEAR (*)    Hgb urine dipstick 1+ (*)    Bacteria, UA RARE (*)     Squamous Epithelial / LPF 0-5 (*)    All other components within normal limits  CBC   ____________________________________________  EKG  I personally interpreted any EKGs ordered by me or triage  ____________________________________________  RADIOLOGY  I reviewed any imaging ordered by me or triage that were performed during my shift and, if possible, patient and/or family made aware of any abnormal findings. ____________________________________________   PROCEDURES  Procedure(s) performed: None  Procedures  Critical Care performed: None  ____________________________________________   INITIAL IMPRESSION / ASSESSMENT AND PLAN / ED COURSE  Pertinent labs & imaging results that were available during my care of the patient were reviewed by me and considered in my medical decision making (see chart for details).  Patient with lower back and flank and lower abdominal discomfort. No evidence of torsion testicular exam is reassuring. No evidence of UTI no evidence of obstructing stone although patient does have hematuria in his urine. I suggest he passed  a stone. No evidence of PE or DVT on exam and patient has no prior history of risk factors for that entity. He denies family history, recent travel, chest pain, shortness of breath he has no dyspnea at set area. Patient sleeping when I and to the room will wake up and tell me his back hurts and go back to sleep. Surgical he could be a muscular skeletal issue. Workup is extensive and reassuring here. We'll send her home with pain medications return follow-up.  Clinical Course   ____________________________________________   FINAL CLINICAL IMPRESSION(S) / ED DIAGNOSES  Final diagnoses:  None      This chart was dictated using voice recognition software.  Despite best efforts to proofread,  errors can occur which can change meaning.      Jeanmarie PlantJames A McShane, MD 04/10/16 1235

## 2016-12-24 IMAGING — CT CT CERVICAL SPINE W/O CM
2 of 3 series · 8 of 14 positions shown, 9 images · non-contrast
Comparison: CT head 04/30/2011

CLINICAL DATA: Fever, headache, and seizure tonight. Head and neck
pain now. History of seizures. Smoker.

EXAM:
CT HEAD WITHOUT CONTRAST
CT CERVICAL SPINE WITHOUT CONTRAST
TECHNIQUE: Multidetector CT imaging of the head and cervical spine was
performed following the standard protocol without intravenous
contrast. Multiplanar CT image reconstructions of the cervical spine
were also generated.

[Series 5: c spine soft · axial · 0.37mm/px · z∈[-310,-192]mm · 4 of 99 slices shown]
[im 20/99  soft-tissue]
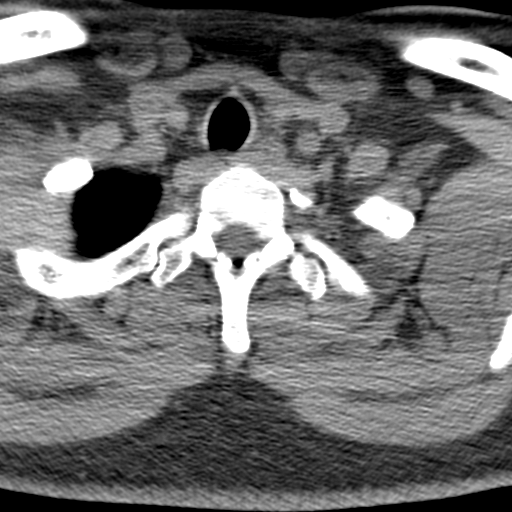
[im 40/99  soft-tissue]
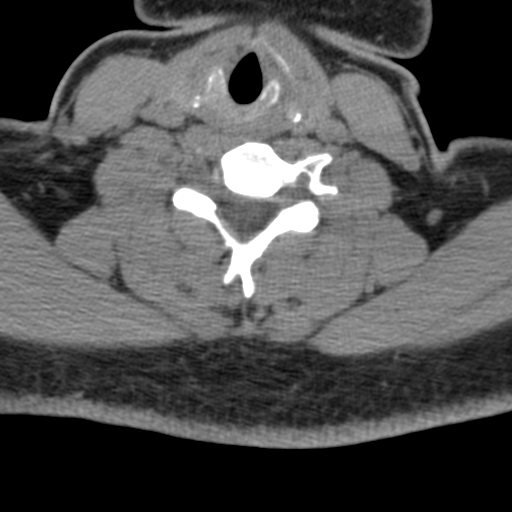
[im 59/99  soft-tissue]
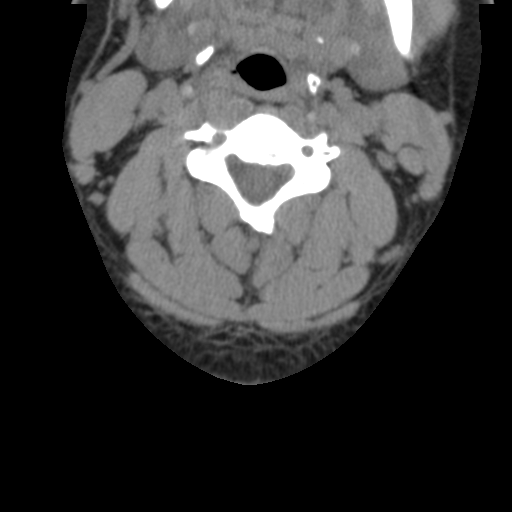
[im 79/99  soft-tissue]
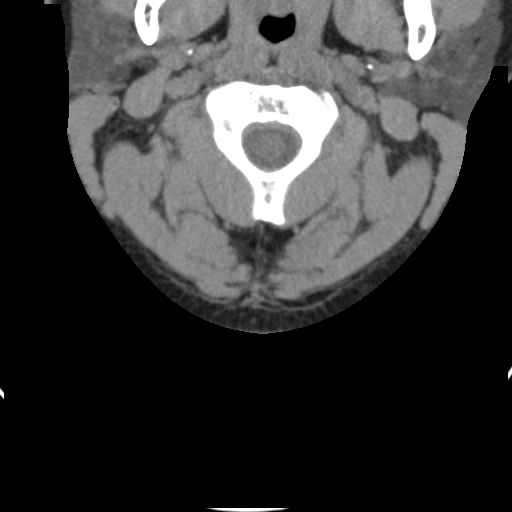

[Series 8: orthogonal axials · axial · 0.21mm/px · z∈[-313,-199]mm · 4 of 99 slices shown, 5 images]
[im 20/99  soft-tissue]
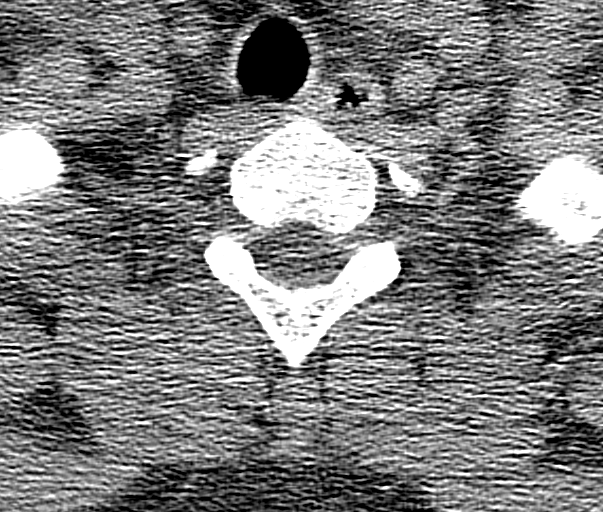
[im 20/99  bone]
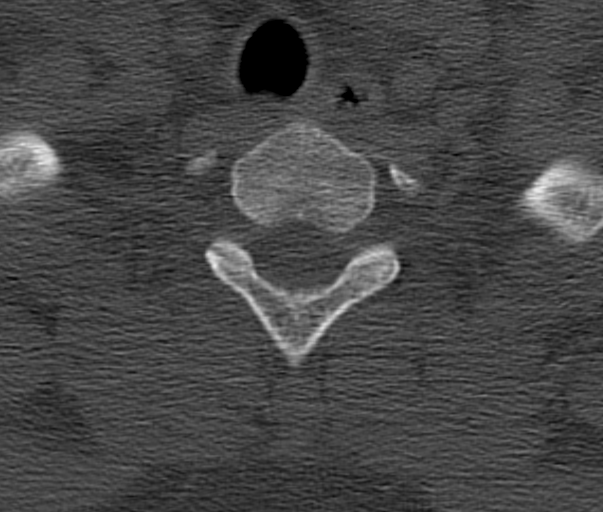
[im 40/99  bone]
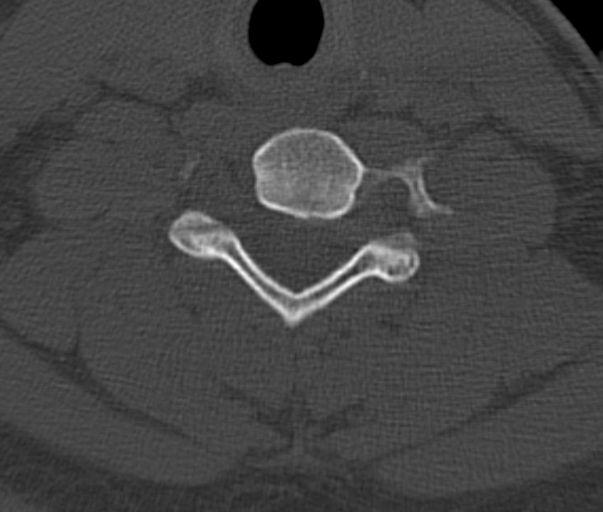
[im 59/99  bone]
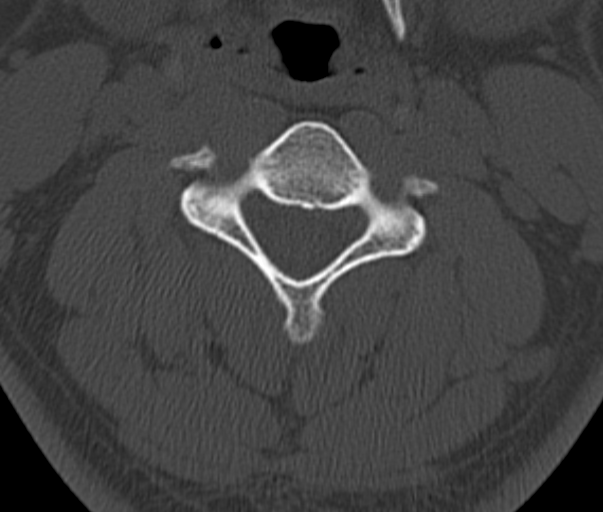
[im 79/99  bone]
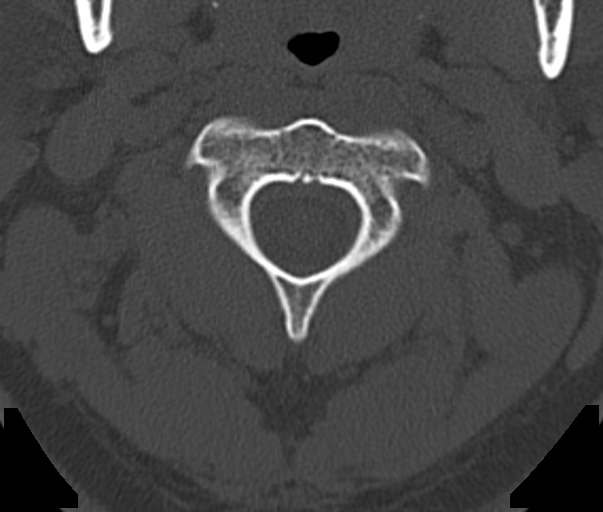

[8 of 14 positions shown; findings below may reference images not displayed]

FINDINGS: CT HEAD FINDINGS

Ventricles and sulci appear symmetrical. No ventricular dilatation.
No mass effect or midline shift. No abnormal extra-axial fluid
collections. Gray-white matter junctions are distinct. Basal
cisterns are not effaced. No evidence of acute intracranial
hemorrhage. No depressed skull fractures. Visualized paranasal
sinuses and mastoid air cells are not opacified.

CT CERVICAL SPINE FINDINGS

Straightening of the usual cervical lordosis. This may be due to
patient positioning but ligamentous injury or muscle spasm could
also have this appearance and is not excluded. No anterior
subluxation. Normal alignment of the posterior elements. No
vertebral compression deformities. Intervertebral disc space heights
are preserved. No prevertebral soft tissue swelling. C1-2
articulation appears intact. No focal bone lesion or bone
destruction. Bone cortex and trabecular architecture appear intact.
IMPRESSION: No acute intracranial abnormalities. Nonspecific straightening of
usual cervical lordosis. No acute displaced fractures identified.

## 2017-11-19 ENCOUNTER — Emergency Department
Admission: EM | Admit: 2017-11-19 | Discharge: 2017-11-19 | Disposition: A | Discharge status: Home / Self Care | Payer: Self-pay | Attending: Emergency Medicine | Admitting: Emergency Medicine

## 2017-11-19 ENCOUNTER — Emergency Department: Payer: Self-pay

## 2017-11-19 ENCOUNTER — Encounter: Payer: Self-pay | Admitting: Emergency Medicine

## 2017-11-19 ENCOUNTER — Other Ambulatory Visit: Payer: Self-pay

## 2017-11-19 DIAGNOSIS — S0083XA Contusion of other part of head, initial encounter: Secondary | ICD-10-CM | POA: Insufficient documentation

## 2017-11-19 DIAGNOSIS — F17210 Nicotine dependence, cigarettes, uncomplicated: Secondary | ICD-10-CM | POA: Insufficient documentation

## 2017-11-19 DIAGNOSIS — S0990XA Unspecified injury of head, initial encounter: Secondary | ICD-10-CM | POA: Insufficient documentation

## 2017-11-19 DIAGNOSIS — Z7982 Long term (current) use of aspirin: Secondary | ICD-10-CM | POA: Insufficient documentation

## 2017-11-19 DIAGNOSIS — Y92002 Bathroom of unspecified non-institutional (private) residence single-family (private) house as the place of occurrence of the external cause: Secondary | ICD-10-CM | POA: Insufficient documentation

## 2017-11-19 DIAGNOSIS — Y93E1 Activity, personal bathing and showering: Secondary | ICD-10-CM | POA: Insufficient documentation

## 2017-11-19 DIAGNOSIS — W182XXA Fall in (into) shower or empty bathtub, initial encounter: Secondary | ICD-10-CM | POA: Insufficient documentation

## 2017-11-19 DIAGNOSIS — M542 Cervicalgia: Secondary | ICD-10-CM | POA: Insufficient documentation

## 2017-11-19 DIAGNOSIS — Y999 Unspecified external cause status: Secondary | ICD-10-CM | POA: Insufficient documentation

## 2017-11-19 DIAGNOSIS — W19XXXA Unspecified fall, initial encounter: Secondary | ICD-10-CM

## 2017-11-19 MED ORDER — HYDROCODONE-ACETAMINOPHEN 5-325 MG PO TABS: 1.0000 | ORAL_TABLET | ORAL | 0 refills | Status: DC | PRN

## 2017-11-19 NOTE — ED Triage Notes (Signed)
Patient presents to the ED post fall.  Patient states he slipped in the shower on a toy around 9:30am.  Patient has obvious redness and swelling to his right cheek.  Patient is complaining of neck pain, lower right back pain and facial pain.  Patient is unsure of whether or not he passed out.  Patient states, "I don't remember."  Patient reports feeling some numbness in palms, neck and face.  Patient is alert and oriented x 4 at this time.

## 2017-11-19 NOTE — ED Provider Notes (Signed)
[facility] Emergency Department Provider Note  Time seen: 1:51 PM  I have reviewed the triage vital signs and the nursing notes.   HISTORY  Chief Complaint Fall    HPI Ricky Alvarado is a 27 y.o. male with a past medical history of migraines, seizure disorder, presents to the emergency department after a fall with a head injury.  According to the patient he was in the shower at approximately 9:30 AM slipped.  Next thing remembers waking up on the shower floor calling for his wife.  Wife states when she got to the shower he was acting normal.  Patient is not sure if he passed out.  Remembers slipping on the ground prior to hitting his head.  Currently patient's only complaints are stiffness to the neck with moderate pain to his right face and a mild headache.  Largely negative review of systems otherwise.   Past Medical History:  Diagnosis Date  . Migraine   . Seizures [facility])     Patient Active Problem List   Diagnosis Date Noted  . 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 none      Prior to Admission medications   Medication Sig Start Date End Date Taking? Authorizing Provider  Aspirin-Caffeine (BAYER BACK & BODY) 500-32.5 MG TABS Take 2 tablets by mouth.    [provider]  azithromycin (ZITHROMAX Z-PAK) 250 MG tablet Take 2 tablets (500 mg) on  Day 1,  followed by 1 tablet (250 mg) once daily on Days 2 through 5. Patient not taking: Reported on 04/10/2016 10/08/15   Evangeline Dakin, PA-C  chlorpheniramine (CHLOR-TRIMETON) 4 MG tablet Take 1 tablet (4 mg total) by mouth 2 (two) times daily as needed for allergies or rhinitis. Patient not taking: Reported on 04/10/2016 10/08/15   Evangeline Dakin, PA-C  docusate sodium (COLACE) 100 MG capsule Take 1 tablet once or twice daily as needed for constipation. Patient not taking: Reported on 04/10/2016 02/23/16   Loleta Rose, MD  guaiFENesin-codeine 100-10 MG/5ML  syrup Take 10 mLs by mouth every 4 (four) hours as needed for cough. Patient not taking: Reported on 04/10/2016 10/08/15   Evangeline Dakin, PA-C  hydrocortisone-pramoxine (PROCTOFOAM [facility]) rectal foam Place 1 applicator rectally 2 (two) times daily. Patient not taking: Reported on 04/10/2016 02/23/16   Loleta Rose, MD  ibuprofen (ADVIL,MOTRIN) 800 MG tablet Take 1 tablet (800 mg total) by mouth every 8 (eight) hours as needed. Patient not taking: Reported on 04/10/2016 10/08/15   Evangeline Dakin, PA-C  phenytoin (DILANTIN) 300 MG ER capsule Take 1 capsule (300 mg total) by mouth at bedtime. Patient not taking: Reported on 04/10/2016 08/01/15   Enedina Finner, MD    No Known Allergies  Family History  Problem Relation Age of Onset  . Diabetes Mellitus II Mother     Social History Social History   Tobacco Use  . Smoking status: Current Some Day Smoker    Packs/day: 0.50    Types: Cigarettes  . Smokeless tobacco: Never Used  Substance Use Topics  . Alcohol use: No  . Drug use: No    Review of Systems Constitutional: Unclear loss of consciousness. Eyes: Negative for visual complaints ENT: Right facial pain. Cardiovascular: Negative for chest pain. Respiratory: Negative for shortness of breath. Gastrointestinal: Negative for abdominal pain Genitourinary: Negative for urinary compaints Musculoskeletal: Moderate neck stiffness Skin: Bruising of the right cheek Neurological: Mild headache All other ROS negative  ____________________________________________   PHYSICAL EXAM:  VITAL SIGNS: ED Triage Vitals  Enc Vitals Group     BP 11/19/17 1050 135/78     Pulse Rate 11/19/17 1050 68     Resp 11/19/17 1050 18     Temp 11/19/17 1050 98.8 F (37.1 C)     Temp Source 11/19/17 1050 Oral     SpO2 11/19/17 1050 98 %     Weight 11/19/17 1053 260 lb (117.9 kg)     Height 11/19/17 1053 6\' 1"  (1.854 m)     Head Circumference --      Peak Flow --      Pain Score 11/19/17 1051 8      Pain Loc --      Pain Edu? --      Excl. in GC? --     Constitutional: Alert and oriented. Well appearing and in no distress. Eyes: Normal exam ENT   Head: Moderate erythema with mild tenderness around the right cheek bone.   Mouth/Throat: Mucous membranes are moist. Cardiovascular: Normal rate, regular rhythm. No murmur Respiratory: Normal respiratory effort without tachypnea nor retractions. Breath sounds are clear  Gastrointestinal: Soft and nontender. No distention.   Musculoskeletal: Nontender with normal range of motion in all extremities.  Mild C-spine tenderness palpation, moderate paraspinal tenderness to palpation. Neurologic:  Normal speech and language. No gross focal neurologic deficits.  Equal grip strength.  No pronator drift.  Extraocular is intact.  Cranial nerves intact. Skin:  Skin is warm, dry and intact.  Mild erythema of the right cheek.  Psychiatric: Mood and affect are normal. Speech and behavior are normal.   ____________________________________________   RADIOLOGY  CT scans of the head, neck and face are negative  ____________________________________________   INITIAL IMPRESSION / ASSESSMENT AND PLAN / ED COURSE  Pertinent labs & imaging results that were available during my care of the patient were reviewed by me and considered in my medical decision making (see chart for details).  Patient presents emergency department after a fall in the shower hitting the right side of his face, unclear loss of consciousness.  Differential would include intracranial hemorrhage, concussion, closed head injury, contusions, muscle strain.  Overall patient appears well, normal physical exam with normal neurological exam.  Mild C-spine tenderness.  CT scan of the head, neck and face are negative for acute abnormality or injury.  Suspect likely concussion with muscle strain.  We will place the patient had a very short course of pain medication and have him follow-up with  his doctor if he continues to have symptoms.    ____________________________________________   FINAL CLINICAL IMPRESSION(S) / ED DIAGNOSES  Fall Head injury    Minna AntisPaduchowski, [place] Emmitt, MD 11/19/17 1356

## 2017-11-19 NOTE — ED Notes (Signed)
C-collar placed on patient in triage.

## 2017-11-19 NOTE — ED Notes (Signed)
Pt discharged home after verbalizing understanding of discharge instructions; nad noted. 

## 2018-06-05 ENCOUNTER — Encounter: Payer: Self-pay | Admitting: Emergency Medicine

## 2018-06-05 ENCOUNTER — Other Ambulatory Visit: Payer: Self-pay

## 2018-06-05 ENCOUNTER — Emergency Department
Admission: EM | Admit: 2018-06-05 | Discharge: 2018-06-05 | Disposition: A | Discharge status: Home / Self Care | Payer: Self-pay | Attending: Emergency Medicine | Admitting: Emergency Medicine

## 2018-06-05 DIAGNOSIS — F17210 Nicotine dependence, cigarettes, uncomplicated: Secondary | ICD-10-CM | POA: Insufficient documentation

## 2018-06-05 DIAGNOSIS — J029 Acute pharyngitis, unspecified: Secondary | ICD-10-CM

## 2018-06-05 DIAGNOSIS — J069 Acute upper respiratory infection, unspecified: Secondary | ICD-10-CM | POA: Insufficient documentation

## 2018-06-05 LAB — GROUP A STREP BY PCR: Group A Strep by PCR: NOT DETECTED

## 2018-06-05 MED ORDER — DEXAMETHASONE 4 MG PO TABS
12.0000 mg | ORAL_TABLET | Freq: Once | ORAL | Status: AC
  Administered 2018-06-05: 12 mg via ORAL
  Filled 2018-06-05: qty 3

## 2018-06-05 MED ORDER — IBUPROFEN 600 MG PO TABS
600.0000 mg | ORAL_TABLET | Freq: Once | ORAL | Status: AC
  Administered 2018-06-05: 600 mg via ORAL
  Filled 2018-06-05: qty 1

## 2018-06-05 MED ORDER — ACETAMINOPHEN 500 MG PO TABS
1000.0000 mg | ORAL_TABLET | Freq: Once | ORAL | Status: AC
  Administered 2018-06-05: 1000 mg via ORAL
  Filled 2018-06-05: qty 2

## 2018-06-05 MED ORDER — LIDOCAINE HCL (PF) 1 % IJ SOLN
5.0000 mL | Freq: Once | INTRAMUSCULAR | Status: AC
  Administered 2018-06-05: 5 mL via INTRADERMAL
  Filled 2018-06-05: qty 5

## 2018-06-05 MED ORDER — BENZONATATE 100 MG PO CAPS: 100.0000 mg | ORAL_CAPSULE | Freq: Four times a day (QID) | ORAL | 0 refills | Status: DC | PRN

## 2018-06-05 MED ORDER — IBUPROFEN 600 MG PO TABS: 600.0000 mg | ORAL_TABLET | Freq: Three times a day (TID) | ORAL | 0 refills | Status: DC | PRN

## 2018-06-05 MED ORDER — OXYMETAZOLINE HCL 0.05 % NA SOLN
1.0000 | Freq: Once | NASAL | Status: AC
  Administered 2018-06-05: 1 via NASAL
  Filled 2018-06-05: qty 15

## 2018-06-05 MED ORDER — PSEUDOEPHEDRINE HCL 30 MG PO TABS
60.0000 mg | ORAL_TABLET | Freq: Once | ORAL | Status: AC
  Administered 2018-06-05: 60 mg via ORAL
  Filled 2018-06-05: qty 2

## 2018-06-05 NOTE — ED Provider Notes (Signed)
[facility] Emergency Department Provider Note  ____________________________________________   First MD Initiated Contact with Patient 06/05/18 [phone]     (approximate)  I have reviewed the triage vital signs and the nursing notes.   HISTORY  Chief Complaint Otalgia and Sore Throat   HPI Ricky Alvarado is a 27 y.o. male self presents to the emergency department with for 5 days of sore throat, slightly productive cough, low-grade fever, and myalgias.  His symptoms began insidiously have been slowly progressive and are now moderate in severity.  His primary concern right now is the sore throat and the cough that is making it difficult to sleep.  He is able to eat and drink.  He is not diabetic.  He has had no recent dental work.  No trauma.  He has some fullness in bilateral ears.  No discharge from his ears.  Some rhinorrhea.  No sick contacts.  Nothing seems to make the symptoms better or worse.    Past Medical History:  Diagnosis Date  . Migraine   . Seizures [facility])     Patient Active Problem List   Diagnosis Date Noted  . 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 none      Prior to Admission medications   Medication Sig Start Date End Date Taking? Authorizing Provider  Aspirin-Caffeine (BAYER BACK & BODY) 500-32.5 MG TABS Take 2 tablets by mouth.    [provider]  azithromycin (ZITHROMAX Z-PAK) 250 MG tablet Take 2 tablets (500 mg) on  Day 1,  followed by 1 tablet (250 mg) once daily on Days 2 through 5. Patient not taking: Reported on 04/10/2016 10/08/15   Evangeline Dakin, PA-C  benzonatate (TESSALON PERLES) 100 MG capsule Take 1 capsule (100 mg total) by mouth every 6 (six) hours as needed for cough. 06/05/18 06/05/19  Merrily Brittle, MD  chlorpheniramine (CHLOR-TRIMETON) 4 MG tablet Take 1 tablet (4 mg total) by mouth 2 (two) times daily as needed for allergies or rhinitis. Patient not taking:  Reported on 04/10/2016 10/08/15   Evangeline Dakin, PA-C  docusate sodium (COLACE) 100 MG capsule Take 1 tablet once or twice daily as needed for constipation. Patient not taking: Reported on 04/10/2016 02/23/16   Loleta Rose, MD  guaiFENesin-codeine 100-10 MG/5ML syrup Take 10 mLs by mouth every 4 (four) hours as needed for cough. Patient not taking: Reported on 04/10/2016 10/08/15   Evangeline Dakin, PA-C  HYDROcodone-acetaminophen (NORCO/VICODIN) 5-325 MG tablet Take 1 tablet by mouth every 4 (four) hours as needed. 11/19/17   Minna Antis, MD  hydrocortisone-pramoxine (PROCTOFOAM [facility]) rectal foam Place 1 applicator rectally 2 (two) times daily. Patient not taking: Reported on 04/10/2016 02/23/16   Loleta Rose, MD  ibuprofen (ADVIL,MOTRIN) 600 MG tablet Take 1 tablet (600 mg total) by mouth every 8 (eight) hours as needed. 06/05/18   Merrily Brittle, MD  phenytoin (DILANTIN) 300 MG ER capsule Take 1 capsule (300 mg total) by mouth at bedtime. Patient not taking: Reported on 04/10/2016 08/01/15   Enedina Finner, MD    Allergies Patient has no known allergies.  Family History  Problem Relation Age of Onset  . Diabetes Mellitus II Mother     Social History Social History   Tobacco Use  . Smoking status: Current Some Day Smoker    Packs/day: 0.50    Types: Cigarettes  . Smokeless tobacco: Never Used  Substance Use Topics  . Alcohol use: No  .  Drug use: No    Review of Systems Constitutional: No fever/chills ENT: Positive for sore throat. Cardiovascular: Positive for chest pain. Respiratory: Positive for cough and shortness of breath Gastrointestinal: No abdominal pain.  No nausea, no vomiting.  No diarrhea.  No constipation. Musculoskeletal: Negative for back pain. Neurological: Positive for headache   ____________________________________________   PHYSICAL EXAM:  VITAL SIGNS: ED Triage Vitals  Enc Vitals Group     BP 06/05/18 0239 (!) 112/93     Pulse Rate 06/05/18  0239 85     Resp 06/05/18 0239 20     Temp 06/05/18 0239 98.3 F (36.8 C)     Temp Source 06/05/18 0239 Oral     SpO2 06/05/18 0239 97 %     Weight 06/05/18 0239 249 lb (112.9 kg)     Height 06/05/18 0239 6\' 1"  (1.854 m)     Head Circumference --      Peak Flow --      Pain Score 06/05/18 0238 7     Pain Loc --      Pain Edu? --      Excl. in GC? --     Constitutional: Alert and oriented x4 appears somewhat uncomfortable with dry cough during my exam Head: Atraumatic. Nose: Positive for congestion Mouth/Throat: No trismus uvula midline he does have pharyngeal erythema but no exudate.  Bilateral tympanic membranes with some clear fluid behind them but no bulging and no erythema.  No mastoid tenderness Neck: No stridor.   No meningismus Cardiovascular: Regular rate and rhythm Respiratory: Normal respiratory effort.  No retractions.  Clear to auscultation bilaterally Gastrointestinal: Soft nontender Neurologic:  Normal speech and language. No gross focal neurologic deficits are appreciated.  Skin:  Skin is warm, dry and intact. No rash noted.    ____________________________________________  LABS (all labs ordered are listed, but only abnormal results are displayed)  Labs Reviewed  GROUP A STREP BY PCR    Strep reviewed by me is negative __________________________________________  EKG   ____________________________________________  RADIOLOGY   ____________________________________________   DIFFERENTIAL includes but not limited to  Streptococcal pharyngitis, viral pharyngitis, pneumonia, viral syndrome   PROCEDURES  Procedure(s) performed: no  Procedures  Critical Care performed: no  ____________________________________________   INITIAL IMPRESSION / ASSESSMENT AND PLAN / ED COURSE  Pertinent labs & imaging results that were available during my care of the patient were reviewed by me and considered in my medical decision making (see chart for  details).   As part of my medical decision making, I reviewed the following data within the electronic MEDICAL RECORD NUMBER History obtained from family if available, nursing notes, old chart and ekg, as well as notes from prior ED visits.  The patient comes to the emergency department uncomfortable appearing with 5 days of cough, ear fullness, sore throat, and congestion along with rhinorrhea.  He has a low Centor score however was swabbed in triage and is negative.  His primary concern is his cough and a nebulized 5 cc of 1% lidocaine with near complete resolution of his symptoms.  Also given Afrin, Sudafed, Tylenol and ibuprofen and he feels improved.  We discussed why antibiotics were not indicated and I will prescribe him a short course of Tessalon Perles.  Given the severity of his pharyngitis was given a single dose of dexamethasone which she really should help resolve his issues shortly.  Strict return precautions have been given.      ____________________________________________   FINAL CLINICAL IMPRESSION(S) /  ED DIAGNOSES  Final diagnoses:  Upper respiratory tract infection, unspecified type  Pharyngitis, unspecified etiology      NEW MEDICATIONS STARTED DURING THIS VISIT:  Discharge Medication List as of 06/05/2018  5:43 AM    START taking these medications   Details  benzonatate (TESSALON PERLES) 100 MG capsule Take 1 capsule (100 mg total) by mouth every 6 (six) hours as needed for cough., Starting Thu 06/05/2018, Until Fri 06/05/2019, Print         Note:  This document was prepared using Dragon voice recognition software and may include unintentional dictation errors.      Merrily Brittle, MD 06/07/18 [phone]

## 2018-06-05 NOTE — Discharge Instructions (Signed)
Its normal to be sick for a full 5 to 6 days with this infection.  Please continue taking ibuprofen and antihistamines as needed for symptomatic control and return to the emergency department for any concerns.

## 2018-06-05 NOTE — ED Triage Notes (Signed)
Patient ambulatory to triage with steady gait, without difficulty or distress noted; pt reports bilat earache, cough and sore throat x wk

## 2018-06-08 ENCOUNTER — Emergency Department: Payer: Self-pay

## 2018-06-08 ENCOUNTER — Other Ambulatory Visit: Payer: Self-pay

## 2018-06-08 ENCOUNTER — Emergency Department
Admission: EM | Admit: 2018-06-08 | Discharge: 2018-06-08 | Disposition: A | Discharge status: Home / Self Care | Payer: Self-pay | Attending: Emergency Medicine | Admitting: Emergency Medicine

## 2018-06-08 DIAGNOSIS — B349 Viral infection, unspecified: Secondary | ICD-10-CM | POA: Insufficient documentation

## 2018-06-08 DIAGNOSIS — F17210 Nicotine dependence, cigarettes, uncomplicated: Secondary | ICD-10-CM | POA: Insufficient documentation

## 2018-06-08 DIAGNOSIS — Z79899 Other long term (current) drug therapy: Secondary | ICD-10-CM | POA: Insufficient documentation

## 2018-06-08 LAB — INFLUENZA PANEL BY PCR (TYPE A & B)
Influenza A By PCR: NEGATIVE
Influenza B By PCR: NEGATIVE

## 2018-06-08 LAB — GROUP A STREP BY PCR: Group A Strep by PCR: NOT DETECTED

## 2018-06-08 MED ORDER — DEXAMETHASONE SODIUM PHOSPHATE 10 MG/ML IJ SOLN
10.0000 mg | Freq: Once | INTRAMUSCULAR | Status: AC
  Administered 2018-06-08: 10 mg via INTRAMUSCULAR
  Filled 2018-06-08: qty 1

## 2018-06-08 MED ORDER — ACETAMINOPHEN 500 MG PO TABS
1000.0000 mg | ORAL_TABLET | Freq: Once | ORAL | Status: AC
  Administered 2018-06-08: 1000 mg via ORAL
  Filled 2018-06-08: qty 2

## 2018-06-08 MED ORDER — PSEUDOEPH-BROMPHEN-DM 30-2-10 MG/5ML PO SYRP: 5.0000 mL | ORAL_SOLUTION | Freq: Four times a day (QID) | ORAL | 0 refills | Status: DC | PRN

## 2018-06-08 NOTE — ED Notes (Addendum)
Pt complains of sore throat, and ear pain. Pt states unable to swallow, but is not spitting anything out at this time. On observation throat appears slightly red but not inflamed. Pt does not appear in any distress at this time, no respiratory distress noted.

## 2018-06-08 NOTE — ED Triage Notes (Addendum)
Patient to triage via wheelchair by EMS with complaint of short of breath.  Per EMS patient awakened with shortness of breath, has happened approximately a week ago.  HR 66, BP 135/78, 98%, CBG 94. Patient has saline loc to left antecub via 20g angiocath.  Patient also reports bilateral ear pain.

## 2018-06-08 NOTE — Discharge Instructions (Addendum)
Follow-up with your primary care provider or [facility] acute care if any continued problems.  Increase fluids.  Tylenol or ibuprofen as needed for muscle aches or fever.  A prescription for Bromfed-DM was sent to your pharmacy for respiratory symptoms.  Discontinue smoking.  Viral illnesses can take up to 2 to 3 weeks to completely resolved.  Lab work included a negative influenza and strep test.  Chest x-ray was negative for pneumonia or bronchitis.

## 2018-06-08 NOTE — ED Provider Notes (Addendum)
[facility] Emergency Department Provider Note  ____________________________________________   First MD Initiated Contact with Patient 06/08/18 0720     (approximate)  I have reviewed the triage vital signs and the nursing notes.   HISTORY  Chief Complaint Shortness of Breath   HPI Ricky Alvarado is a 27 y.o. male presents to the ED via EMS with complaint of shortness of breath.  Patient states that he was seen approximately 1 week ago and was told he had an upper respiratory infection.  Patient states that he has bilateral ear pain and throat pain.  He states that times it is incredibly painful for him to swallow.  He has not taken any over-the-counter medication.  He is unaware of any fever.  He denies any nausea, vomiting or diarrhea.  Patient smokes half pack of cigarettes per day until he got sick.  Patient was seen earlier and prescribed Tessalon Perles which he did not purchase as he states "they were too expensive".  Currently rates his pain as a 10/10.   Past Medical History:  Diagnosis Date  . Migraine   . Seizures [facility])     Patient Active Problem List   Diagnosis Date Noted  . 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 none      Prior to Admission medications   Medication Sig Start Date End Date Taking? Authorizing Provider  Aspirin-Caffeine (BAYER BACK & BODY) 500-32.5 MG TABS Take 2 tablets by mouth.    [provider]  brompheniramine-pseudoephedrine-DM 30-2-10 MG/5ML syrup Take 5 mLs by mouth 4 (four) times daily as needed. 06/08/18   Tommi Rumps, PA-C  ibuprofen (ADVIL,MOTRIN) 600 MG tablet Take 1 tablet (600 mg total) by mouth every 8 (eight) hours as needed. 06/05/18   Merrily Brittle, MD  phenytoin (DILANTIN) 300 MG ER capsule Take 1 capsule (300 mg total) by mouth at bedtime. Patient not taking: Reported on 04/10/2016 08/01/15   Enedina Finner, MD    Allergies Patient has no  known allergies.  Family History  Problem Relation Age of Onset  . Diabetes Mellitus II Mother     Social History Social History   Tobacco Use  . Smoking status: Current Some Day Smoker    Packs/day: 0.50    Types: Cigarettes  . Smokeless tobacco: Never Used  Substance Use Topics  . Alcohol use: No  . Drug use: No    Review of Systems Constitutional: Subjective fever/chills Eyes: No visual changes. ENT: Positive sore throat.  Positive bilateral ear pain. Cardiovascular: Denies chest pain. Respiratory: Denies shortness of breath.  Positive cough. Gastrointestinal: No abdominal pain.  No nausea, no vomiting.  No diarrhea.   Genitourinary: Negative for dysuria. Musculoskeletal: Negative for muscle aches. Skin: Negative for rash. Neurological: Negative for headaches, focal weakness or numbness. ____________________________________________   PHYSICAL EXAM:  VITAL SIGNS: ED Triage Vitals  Enc Vitals Group     BP 06/08/18 0640 140/70     Pulse Rate 06/08/18 0640 (!) 59     Resp 06/08/18 0640 18     Temp 06/08/18 0640 98.2 F (36.8 C)     Temp Source 06/08/18 0640 Oral     SpO2 06/08/18 0640 98 %     Weight --      Height --      Head Circumference --      Peak Flow --      Pain Score 06/08/18 0637 10  Pain Loc --      Pain Edu? --      Excl. in [place]? --    Constitutional: Alert and oriented. Well appearing and in no acute distress. Eyes: Conjunctivae are normal. PERRL. EOMI. Head: Atraumatic. Nose: Mild congestion/rhinnorhea.  EACs and TMs are clear bilaterally. Mouth/Throat: Mucous membranes are moist.  Oropharynx non-erythematous.  Posterior drainage noted. Neck: No stridor.   Hematological/Lymphatic/Immunilogical: No cervical lymphadenopathy. Cardiovascular: Normal rate, regular rhythm. Grossly normal heart sounds.  Good peripheral circulation. Respiratory: Normal respiratory effort.  No retractions. Lungs CTAB. Gastrointestinal: Soft and nontender. No  distention.  Bowel sounds  normoactive x4 quadrants. Musculoskeletal: No lower extremity tenderness nor edema.  No joint effusions. Neurologic:  Normal speech and language. No gross focal neurologic deficits are appreciated. No gait instability. Skin:  Skin is warm, dry and intact. No rash noted. Psychiatric: Mood and affect are normal. Speech and behavior are normal.  ____________________________________________   LABS (all labs ordered are listed, but only abnormal results are displayed)  Labs Reviewed  GROUP A STREP BY PCR  INFLUENZA PANEL BY PCR (TYPE A & B)    RADIOLOGY  ED MD interpretation:   Chest x-ray is negative for pneumonia or bronchitis.  Official radiology report(s): Dg Chest 2 View  Result Date: 06/08/2018 CLINICAL DATA:  Shortness of breath EXAM: CHEST - 2 VIEW COMPARISON:  10/08/2015 FINDINGS: Lungs are clear.  No pleural effusion or pneumothorax. The heart is normal in size. Visualized osseous structures are within normal limits. IMPRESSION: Normal chest radiographs. Electronically Signed   By: Charline Bills M.D.   On: 06/08/2018 08:57   ____________________________________________   PROCEDURES  Procedure(s) performed: None  Procedures  Critical Care performed: No  ____________________________________________   INITIAL IMPRESSION / ASSESSMENT AND PLAN / ED COURSE  As part of my medical decision making, I reviewed the following data within the electronic MEDICAL RECORD NUMBER Notes from prior ED visits and [place] Controlled Substance Database  27 year old male comes to the emergency department via EMS with complaint of cough, congestion, sore throat, ear pain for approximately 1 week.  He also states he has had some shortness of breath with his coughing.  He was seen earlier in the ED 3 days ago at which time he was diagnosed with an upper respiratory infection and prescribed Tessalon Perles which he did not purchase.  Patient is a smoker but discontinued due  to his illness.  Lab tests were negative for both strep and influenza.  Chest x-ray was negative.  Patient was observed with minimal cough and slept most of his stay in the ED. he was given Decadron 10 mg IM in the department to help with his throat pain while waiting for test results and his wife to pick him up.  Patient was given prescription for Bromfed-DM as needed for cough and congestion.  He is encouraged to increase fluids and take Tylenol or ibuprofen as needed for fever or body aches.  He is to follow-up with his PCP if any continued problems or [facility] acute care.  ____________________________________________   FINAL CLINICAL IMPRESSION(S) / ED DIAGNOSES  Final diagnoses:  Viral illness     ED Discharge Orders         Ordered    brompheniramine-pseudoephedrine-DM 30-2-10 MG/5ML syrup  4 times daily PRN     06/08/18 0950           Note:  This document was prepared using Dragon voice recognition software and may include unintentional dictation  errors.    Tommi Rumps, PA-C 06/08/18 1115    Tommi Rumps, PA-C 06/08/18 1116    Minna Antis, MD 06/08/18 1250

## 2018-07-07 ENCOUNTER — Encounter: Payer: Self-pay | Admitting: Emergency Medicine

## 2018-07-07 ENCOUNTER — Other Ambulatory Visit: Payer: Self-pay

## 2018-07-07 ENCOUNTER — Emergency Department: Payer: Self-pay

## 2018-07-07 DIAGNOSIS — R040 Epistaxis: Secondary | ICD-10-CM | POA: Insufficient documentation

## 2018-07-07 DIAGNOSIS — M791 Myalgia, unspecified site: Secondary | ICD-10-CM | POA: Insufficient documentation

## 2018-07-07 DIAGNOSIS — R05 Cough: Secondary | ICD-10-CM | POA: Insufficient documentation

## 2018-07-07 DIAGNOSIS — R0602 Shortness of breath: Secondary | ICD-10-CM | POA: Insufficient documentation

## 2018-07-07 DIAGNOSIS — R509 Fever, unspecified: Secondary | ICD-10-CM | POA: Insufficient documentation

## 2018-07-07 DIAGNOSIS — Z87891 Personal history of nicotine dependence: Secondary | ICD-10-CM | POA: Insufficient documentation

## 2018-07-07 DIAGNOSIS — R51 Headache: Secondary | ICD-10-CM | POA: Insufficient documentation

## 2018-07-07 DIAGNOSIS — R0781 Pleurodynia: Secondary | ICD-10-CM | POA: Insufficient documentation

## 2018-07-07 DIAGNOSIS — Z7982 Long term (current) use of aspirin: Secondary | ICD-10-CM | POA: Insufficient documentation

## 2018-07-07 MED ORDER — ACETAMINOPHEN 500 MG PO TABS
1000.0000 mg | ORAL_TABLET | Freq: Once | ORAL | Status: AC
  Administered 2018-07-07: 1000 mg via ORAL
  Filled 2018-07-07: qty 2

## 2018-07-07 NOTE — ED Triage Notes (Addendum)
Patient ambulatory to triage with steady gait, without difficulty or distress noted, mask in place; pt reports x 2 days prod cough green/bloody sputum, generalized HA and recent epistaxis

## 2018-07-08 ENCOUNTER — Emergency Department
Admission: EM | Admit: 2018-07-08 | Discharge: 2018-07-08 | Disposition: A | Discharge status: Home / Self Care | Payer: Self-pay | Attending: Student in an Organized Health Care Education/Training Program | Admitting: Student in an Organized Health Care Education/Training Program

## 2018-07-08 DIAGNOSIS — R040 Epistaxis: Secondary | ICD-10-CM

## 2018-07-08 DIAGNOSIS — R059 Cough, unspecified: Secondary | ICD-10-CM

## 2018-07-08 DIAGNOSIS — R05 Cough: Secondary | ICD-10-CM

## 2018-07-08 LAB — COMPREHENSIVE METABOLIC PANEL
ALT: 91 U/L — ABNORMAL HIGH (ref 0–44)
ANION GAP: 9 (ref 5–15)
AST: 66 U/L — ABNORMAL HIGH (ref 15–41)
Albumin: 4.5 g/dL (ref 3.5–5.0)
Alkaline Phosphatase: 82 U/L (ref 38–126)
BUN: 13 mg/dL (ref 6–20)
CHLORIDE: 107 mmol/L (ref 98–111)
CO2: 24 mmol/L (ref 22–32)
Calcium: 9.1 mg/dL (ref 8.9–10.3)
Creatinine, Ser: 1.15 mg/dL (ref 0.61–1.24)
Glucose, Bld: 119 mg/dL — ABNORMAL HIGH (ref 70–99)
Potassium: 4.1 mmol/L (ref 3.5–5.1)
SODIUM: 140 mmol/L (ref 135–145)
Total Bilirubin: 1.1 mg/dL (ref 0.3–1.2)
Total Protein: 7.7 g/dL (ref 6.5–8.1)

## 2018-07-08 LAB — CBC WITH DIFFERENTIAL/PLATELET
Abs Immature Granulocytes: 0.07 10*3/uL (ref 0.00–0.07)
BASOS PCT: 1 %
Basophils Absolute: 0.1 10*3/uL (ref 0.0–0.1)
EOS ABS: 0.3 10*3/uL (ref 0.0–0.5)
Eosinophils Relative: 3 %
HCT: 44.6 % (ref 39.0–52.0)
Hemoglobin: 15.4 g/dL (ref 13.0–17.0)
Immature Granulocytes: 1 %
Lymphocytes Relative: 18 %
Lymphs Abs: 2.1 10*3/uL (ref 0.7–4.0)
MCH: 32.9 pg (ref 26.0–34.0)
MCHC: 34.5 g/dL (ref 30.0–36.0)
MCV: 95.3 fL (ref 80.0–100.0)
MONO ABS: 1.5 10*3/uL — AB (ref 0.1–1.0)
MONOS PCT: 13 %
NEUTROS ABS: 8.1 10*3/uL — AB (ref 1.7–7.7)
Neutrophils Relative %: 64 %
PLATELETS: 181 10*3/uL (ref 150–400)
RBC: 4.68 MIL/uL (ref 4.22–5.81)
RDW: 12.7 % (ref 11.5–15.5)
WBC: 12.2 10*3/uL — AB (ref 4.0–10.5)
nRBC: 0 % (ref 0.0–0.2)

## 2018-07-08 MED ORDER — IPRATROPIUM-ALBUTEROL 0.5-2.5 (3) MG/3ML IN SOLN
3.0000 mL | Freq: Once | RESPIRATORY_TRACT | Status: AC
  Administered 2018-07-08: 3 mL via RESPIRATORY_TRACT
  Filled 2018-07-08: qty 3

## 2018-07-08 MED ORDER — ALBUTEROL SULFATE HFA 108 (90 BASE) MCG/ACT IN AERS: 2.0000 | INHALATION_SPRAY | Freq: Four times a day (QID) | RESPIRATORY_TRACT | 2 refills | Status: AC | PRN

## 2018-07-08 MED ORDER — PREDNISONE 20 MG PO TABS
60.0000 mg | ORAL_TABLET | Freq: Once | ORAL | Status: AC
Start: 2018-07-08 — End: 2018-07-08
  Administered 2018-07-08: 60 mg via ORAL
  Filled 2018-07-08: qty 3

## 2018-07-08 MED ORDER — PREDNISONE 20 MG PO TABS: 40.0000 mg | ORAL_TABLET | Freq: Every day | ORAL | 0 refills | Status: AC

## 2018-07-08 MED ORDER — KETOROLAC TROMETHAMINE 30 MG/ML IJ SOLN
15.0000 mg | Freq: Once | INTRAMUSCULAR | Status: AC
  Administered 2018-07-08: 15 mg via INTRAVENOUS

## 2018-07-08 MED ORDER — LIDOCAINE-EPINEPHRINE 2 %-1:100000 IJ SOLN
20.0000 mL | Freq: Once | INTRAMUSCULAR | Status: AC
  Administered 2018-07-08: 20 mL
  Filled 2018-07-08: qty 1

## 2018-07-08 MED ORDER — OXYMETAZOLINE HCL 0.05 % NA SOLN
1.0000 | Freq: Once | NASAL | Status: AC
  Administered 2018-07-08: 1 via NASAL
  Filled 2018-07-08: qty 15

## 2018-07-08 MED ORDER — AZITHROMYCIN 500 MG PO TABS: 500.0000 mg | ORAL_TABLET | Freq: Every day | ORAL | 0 refills | Status: AC

## 2018-07-08 MED ORDER — GUAIFENESIN 100 MG/5ML PO SOLN
5.0000 mL | Freq: Once | ORAL | Status: AC
  Administered 2018-07-08: 100 mg via ORAL
  Filled 2018-07-08: qty 5

## 2018-07-08 MED ORDER — KETOROLAC TROMETHAMINE 30 MG/ML IJ SOLN
15.0000 mg | Freq: Once | INTRAMUSCULAR | Status: DC
  Filled 2018-07-08: qty 1

## 2018-07-08 NOTE — ED Notes (Signed)
ED Provider at bedside. 

## 2018-07-08 NOTE — ED Provider Notes (Signed)
[facility] Emergency Department Provider Note    First MD Initiated Contact with Patient 07/08/18 0101     (approximate)  I have reviewed the triage vital signs and the nursing notes.   HISTORY  Chief Complaint Cough    HPI Ricky Alvarado is a 27 y.o. male history of migraine seizures presents the ER for evaluation of cough fever shortness of breath and rib pain.  States been having cough with green sputum.  Is also had generalized myalgias and a headache started roughly 2 days prior.  Daughter is sick with similar symptoms and diagnosed with a "common cold".  Patient states that today he started developing bleeding out of the left nare roughly 3 hours prior.  States that he quit smoking roughly 3 weeks ago.    Past Medical History:  Diagnosis Date  . Migraine   . Seizures (HCC)    Family History  Problem Relation Age of Onset  . Diabetes Mellitus II Mother    Past Surgical History:  Procedure Laterality Date  . none     Patient Active Problem List   Diagnosis Date Noted  . Meningitis 07/31/2015      Prior to Admission medications   Medication Sig Start Date End Date Taking? Authorizing Provider  albuterol (PROVENTIL HFA;VENTOLIN HFA) 108 (90 Base) MCG/ACT inhaler Inhale 2 puffs into the lungs every 6 (six) hours as needed for wheezing or shortness of breath. 07/08/18   Willy Eddy, MD  Aspirin-Caffeine (BAYER BACK & BODY) 500-32.5 MG TABS Take 2 tablets by mouth.    [provider]  azithromycin (ZITHROMAX) 500 MG tablet Take 1 tablet (500 mg total) by mouth daily for 3 days. Take 1 tablet daily for 3 days. 07/08/18 07/11/18  Willy Eddy, MD  brompheniramine-pseudoephedrine-DM 30-2-10 MG/5ML syrup Take 5 mLs by mouth 4 (four) times daily as needed. 06/08/18   Tommi Rumps, PA-C  ibuprofen (ADVIL,MOTRIN) 600 MG tablet Take 1 tablet (600 mg total) by mouth every 8 (eight) hours as needed. 06/05/18   Merrily Brittle,  MD  phenytoin (DILANTIN) 300 MG ER capsule Take 1 capsule (300 mg total) by mouth at bedtime. Patient not taking: Reported on 04/10/2016 08/01/15   Enedina Finner, MD  predniSONE (DELTASONE) 20 MG tablet Take 2 tablets (40 mg total) by mouth daily for 5 days. 07/08/18 07/13/18  Willy Eddy, MD    Allergies Patient has no known allergies.    Social History Social History   Tobacco Use  . Smoking status: Current Some Day Smoker    Packs/day: 0.50    Types: Cigarettes  . Smokeless tobacco: Never Used  Substance Use Topics  . Alcohol use: No  . Drug use: No    Review of Systems Patient denies headaches, rhinorrhea, blurry vision, numbness, shortness of breath, chest pain, edema, cough, abdominal pain, nausea, vomiting, diarrhea, dysuria, fevers, rashes or hallucinations unless otherwise stated above in HPI. ____________________________________________   PHYSICAL EXAM:  VITAL SIGNS: Vitals:   07/07/18 2328 07/08/18 0224  BP: (!) 160/82   Pulse: 93   Resp: 20   Temp: (!) 100.4 F (38 C) 98.8 F (37.1 C)  SpO2: 94%     Constitutional: Alert and oriented.  Eyes: Conjunctivae are normal.  Head: Atraumatic. Nose: No congestion/rhinnorhea.  bleeding to left anterior nare hyperemic left nare, no active hemorrhage, no polyp or mass. Mouth/Throat: Mucous membranes are moist.   Neck: No stridor. Painless ROM.  Cardiovascular: Normal rate, regular rhythm. Grossly normal  heart sounds.  Good peripheral circulation. Respiratory: Normal respiratory effort.  No retractions. Lungs with coarse wheeze throughout Gastrointestinal: Soft and nontender. No distention. No abdominal bruits. No CVA tenderness. Genitourinary: deferred Musculoskeletal: No lower extremity tenderness nor edema.  No joint effusions. Neurologic:  Normal speech and language. No gross focal neurologic deficits are appreciated. No facial droop Skin:  Skin is warm, dry and intact. No rash noted. Psychiatric: Mood and  affect are normal. Speech and behavior are normal.  ____________________________________________   LABS (all labs ordered are listed, but only abnormal results are displayed)  Results for orders placed or performed during the hospital encounter of 07/08/18 (from the past 24 hour(s))  CBC with Differential/Platelet     Status: Abnormal   Collection Time: 07/08/18  1:45 AM  Result Value Ref Range   WBC 12.2 (H) 4.0 - 10.5 K/uL   RBC 4.68 4.22 - 5.81 MIL/uL   Hemoglobin 15.4 13.0 - 17.0 g/dL   HCT 40.9 81.1 - 91.4 %   MCV 95.3 80.0 - 100.0 fL   MCH 32.9 26.0 - 34.0 pg   MCHC 34.5 30.0 - 36.0 g/dL   RDW 78.2 95.6 - 21.3 %   Platelets 181 150 - 400 K/uL   nRBC 0.0 0.0 - 0.2 %   Neutrophils Relative % 64 %   Neutro Abs 8.1 (H) 1.7 - 7.7 K/uL   Lymphocytes Relative 18 %   Lymphs Abs 2.1 0.7 - 4.0 K/uL   Monocytes Relative 13 %   Monocytes Absolute 1.5 (H) 0.1 - 1.0 K/uL   Eosinophils Relative 3 %   Eosinophils Absolute 0.3 0.0 - 0.5 K/uL   Basophils Relative 1 %   Basophils Absolute 0.1 0.0 - 0.1 K/uL   Immature Granulocytes 1 %   Abs Immature Granulocytes 0.07 0.00 - 0.07 K/uL  Comprehensive metabolic panel     Status: Abnormal   Collection Time: 07/08/18  1:45 AM  Result Value Ref Range   Sodium 140 135 - 145 mmol/L   Potassium 4.1 3.5 - 5.1 mmol/L   Chloride 107 98 - 111 mmol/L   CO2 24 22 - 32 mmol/L   Glucose, Bld 119 (H) 70 - 99 mg/dL   BUN 13 6 - 20 mg/dL   Creatinine, Ser 0.86 0.61 - 1.24 mg/dL   Calcium 9.1 8.9 - 57.8 mg/dL   Total Protein 7.7 6.5 - 8.1 g/dL   Albumin 4.5 3.5 - 5.0 g/dL   AST 66 (H) 15 - 41 U/L   ALT 91 (H) 0 - 44 U/L   Alkaline Phosphatase 82 38 - 126 U/L   Total Bilirubin 1.1 0.3 - 1.2 mg/dL   GFR calc non Af Amer >60 >60 mL/min   GFR calc Af Amer >60 >60 mL/min   Anion gap 9 5 - 15   ____________________________________________ ____________________________________________  RADIOLOGY  I personally reviewed all radiographic images ordered  to evaluate for the above acute complaints and reviewed radiology reports and findings.  These findings were personally discussed with the patient.  Please see medical record for radiology report.  ____________________________________________   PROCEDURES  Procedure(s) performed:  Procedures    Critical Care performed: no ____________________________________________   INITIAL IMPRESSION / ASSESSMENT AND PLAN / ED COURSE  Pertinent labs & imaging results that were available during my care of the patient were reviewed by me and considered in my medical decision making (see chart for details).   DDX: pna, bronchitis, flu like illness, vasculitis  Dione L  Lillia MountainWilkins is a 27 y.o. who presents to the ED with symptoms as described above.  Will check basic blood work.  Will give nebulizer and Toradol.  The patient will be placed on continuous pulse oximetry and telemetry for monitoring.  Laboratory evaluation will be sent to evaluate for the above complaints.     Clinical Course as of Jul 09 307  Tue Jul 08, 2018  0237 Recheck after lidocaine and Afrin application PE.  Patient does have hyperemic left anterior nare no evidence of active bleeding or polyps at this time.  We will continue to observe.  Is not on anticoagulation.  Renal function is normal.  Doubt vasculitis or other insidious process.  Given his productive cough a course of azithromycin as well as nebulizer and pred.   [PR]    Clinical Course User Index [PR] Willy Eddyobinson, Kallie Depolo, MD     As part of my medical decision making, I reviewed the following data within the electronic MEDICAL RECORD NUMBER Nursing notes reviewed and incorporated, Labs reviewed, notes from prior ED visits.   ____________________________________________   FINAL CLINICAL IMPRESSION(S) / ED DIAGNOSES  Final diagnoses:  Cough  Epistaxis      NEW MEDICATIONS STARTED DURING THIS VISIT:  New Prescriptions   ALBUTEROL (PROVENTIL HFA;VENTOLIN HFA) 108  (90 BASE) MCG/ACT INHALER    Inhale 2 puffs into the lungs every 6 (six) hours as needed for wheezing or shortness of breath.   AZITHROMYCIN (ZITHROMAX) 500 MG TABLET    Take 1 tablet (500 mg total) by mouth daily for 3 days. Take 1 tablet daily for 3 days.   PREDNISONE (DELTASONE) 20 MG TABLET    Take 2 tablets (40 mg total) by mouth daily for 5 days.     Note:  This document was prepared using Dragon voice recognition software and may include unintentional dictation errors.    Willy Eddyobinson, Celestina Gironda, MD 07/08/18 [phone]

## 2018-07-08 NOTE — ED Notes (Signed)
Reviewed discharge instructions, follow-up care, and prescriptions with patient. Patient verbalized understanding of all information reviewed. Patient stable, with no distress noted at this time.    

## 2018-07-08 NOTE — ED Notes (Signed)
Called pharmacy to request medication 

## 2019-04-10 ENCOUNTER — Emergency Department
Admission: EM | Admit: 2019-04-10 | Discharge: 2019-04-10 | Disposition: A | Discharge status: Home / Self Care | Payer: No Typology Code available for payment source | Attending: Emergency Medicine | Admitting: Emergency Medicine

## 2019-04-10 ENCOUNTER — Emergency Department: Payer: No Typology Code available for payment source

## 2019-04-10 ENCOUNTER — Other Ambulatory Visit: Payer: Self-pay

## 2019-04-10 DIAGNOSIS — Y999 Unspecified external cause status: Secondary | ICD-10-CM | POA: Insufficient documentation

## 2019-04-10 DIAGNOSIS — Z79899 Other long term (current) drug therapy: Secondary | ICD-10-CM | POA: Diagnosis not present

## 2019-04-10 DIAGNOSIS — Y939 Activity, unspecified: Secondary | ICD-10-CM | POA: Diagnosis not present

## 2019-04-10 DIAGNOSIS — Y929 Unspecified place or not applicable: Secondary | ICD-10-CM | POA: Diagnosis not present

## 2019-04-10 DIAGNOSIS — F17210 Nicotine dependence, cigarettes, uncomplicated: Secondary | ICD-10-CM | POA: Insufficient documentation

## 2019-04-10 DIAGNOSIS — S161XXA Strain of muscle, fascia and tendon at neck level, initial encounter: Secondary | ICD-10-CM | POA: Diagnosis not present

## 2019-04-10 DIAGNOSIS — S199XXA Unspecified injury of neck, initial encounter: Secondary | ICD-10-CM | POA: Diagnosis present

## 2019-04-10 MED ORDER — TRAMADOL HCL 50 MG PO TABS: 50.0000 mg | ORAL_TABLET | Freq: Four times a day (QID) | ORAL | 0 refills | Status: DC | PRN

## 2019-04-10 MED ORDER — CYCLOBENZAPRINE HCL 10 MG PO TABS: 10.0000 mg | ORAL_TABLET | Freq: Three times a day (TID) | ORAL | 0 refills | Status: DC | PRN
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
## 2019-04-10 NOTE — ED Notes (Signed)
Pt verbalized understanding of discharge instructions. NAD at this time. 
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
## 2019-11-30 ENCOUNTER — Other Ambulatory Visit: Payer: Self-pay

## 2019-11-30 ENCOUNTER — Emergency Department
Admission: EM | Admit: 2019-11-30 | Discharge: 2019-11-30 | Disposition: A | Discharge status: Home / Self Care | Payer: Self-pay | Attending: Student in an Organized Health Care Education/Training Program | Admitting: Student in an Organized Health Care Education/Training Program

## 2019-11-30 DIAGNOSIS — R112 Nausea with vomiting, unspecified: Secondary | ICD-10-CM | POA: Insufficient documentation

## 2019-11-30 DIAGNOSIS — Z79899 Other long term (current) drug therapy: Secondary | ICD-10-CM | POA: Insufficient documentation

## 2019-11-30 DIAGNOSIS — K649 Unspecified hemorrhoids: Secondary | ICD-10-CM | POA: Insufficient documentation

## 2019-11-30 DIAGNOSIS — R197 Diarrhea, unspecified: Secondary | ICD-10-CM | POA: Insufficient documentation

## 2019-11-30 DIAGNOSIS — F17210 Nicotine dependence, cigarettes, uncomplicated: Secondary | ICD-10-CM | POA: Insufficient documentation

## 2019-11-30 LAB — COMPREHENSIVE METABOLIC PANEL
ALT: 55 U/L — ABNORMAL HIGH (ref 0–44)
AST: 31 U/L (ref 15–41)
Albumin: 4.7 g/dL (ref 3.5–5.0)
Alkaline Phosphatase: 60 U/L (ref 38–126)
Anion gap: 10 (ref 5–15)
BUN: 12 mg/dL (ref 6–20)
CO2: 24 mmol/L (ref 22–32)
Calcium: 9.4 mg/dL (ref 8.9–10.3)
Chloride: 103 mmol/L (ref 98–111)
Creatinine, Ser: 1.33 mg/dL — ABNORMAL HIGH (ref 0.61–1.24)
GFR calc Af Amer: >60 mL/min (ref >60)
GFR calc non Af Amer: >60 mL/min (ref >60)
Glucose, Bld: 160 mg/dL — ABNORMAL HIGH (ref 70–99)
Potassium: 3.9 mmol/L (ref 3.5–5.1)
Sodium: 137 mmol/L (ref 135–145)
Total Bilirubin: 1.3 mg/dL — ABNORMAL HIGH (ref 0.3–1.2)
Total Protein: 7.2 g/dL (ref 6.5–8.1)

## 2019-11-30 LAB — CBC
HCT: 46.9 % (ref 39.0–52.0)
Hemoglobin: 16.3 g/dL (ref 13.0–17.0)
MCH: 33.2 pg (ref 26.0–34.0)
MCHC: 34.8 g/dL (ref 30.0–36.0)
MCV: 95.5 fL (ref 80.0–100.0)
Platelets: 220 10*3/uL (ref 150–400)
RBC: 4.91 MIL/uL (ref 4.22–5.81)
RDW: 12.4 % (ref 11.5–15.5)
WBC: 10.1 10*3/uL (ref 4.0–10.5)
nRBC: 0 % (ref 0.0–0.2)

## 2019-11-30 LAB — LIPASE, BLOOD: Lipase: 32 U/L (ref 11–51)

## 2019-11-30 MED ORDER — LIDOCAINE VISCOUS HCL 2 % MT SOLN
15.0000 mL | Freq: Once | OROMUCOSAL | Status: AC
  Administered 2019-11-30: 16:00:00 15 mL via ORAL
  Filled 2019-11-30: qty 15

## 2019-11-30 MED ORDER — OMEPRAZOLE 40 MG PO CPDR: 40.0000 mg | DELAYED_RELEASE_CAPSULE | Freq: Every day | ORAL | 1 refills | Status: AC

## 2019-11-30 MED ORDER — ONDANSETRON 4 MG PO TBDP: 4.0000 mg | ORAL_TABLET | Freq: Three times a day (TID) | ORAL | 0 refills | Status: DC | PRN

## 2019-11-30 MED ORDER — ONDANSETRON 4 MG PO TBDP
4.0000 mg | ORAL_TABLET | Freq: Once | ORAL | Status: AC
  Administered 2019-11-30: 16:00:00 4 mg via ORAL
  Filled 2019-11-30: qty 1

## 2019-11-30 MED ORDER — HYDROCORTISONE ACETATE 25 MG RE SUPP: 25.0000 mg | Freq: Two times a day (BID) | RECTAL | 0 refills | Status: AC

## 2019-11-30 MED ORDER — ALUM & MAG HYDROXIDE-SIMETH 200-200-20 MG/5ML PO SUSP
30.0000 mL | Freq: Once | ORAL | Status: AC
  Administered 2019-11-30: 16:00:00 30 mL via ORAL
  Filled 2019-11-30: qty 30

## 2019-11-30 MED ORDER — SODIUM CHLORIDE 0.9% FLUSH: 3.0000 mL | Freq: Once | INTRAVENOUS | Status: DC

## 2019-11-30 NOTE — Discharge Instructions (Signed)
Your exam and labs are consistent with a likely viral gastroenteritis or a bad food exposure. You should eat small meals like Gatorade, ginger ale, bread, crackers, toast, rice, or grits. Follow-up with the [facility] for routine medical care. Take the prescription meds as directed. Return as needed.

## 2019-11-30 NOTE — ED Triage Notes (Signed)
Pt comes with c/o abdominal pain, N/V/D. Pt states this started yesterday morning and has since gotten worse.  Pt states he thinks it might be something he ate.

## 2019-11-30 NOTE — ED Provider Notes (Signed)
[facility] Emergency Department Provider Note ____________________________________________  Time seen: 1537  I have reviewed the triage vital signs and the nursing notes.  HISTORY  Chief Complaint  Abdominal Pain  HPI Ricky Alvarado is a 29 y.o. male presents himself to the ED for evaluation of abdominal pain, nausea, vomiting, and diarrhea.  Patient also describes  bright red blood on the toilet tissue after bowel movements.  He describes onset of symptoms yesterday after he and the family ate a meal that his wife had cooked on the grill.  He reports similar symptoms in his wife and his daughter.  He is unclear of whether they had a bad food exposure due to expired food versus undercooked food.  He does give a history of hemorrhoids, and reports that the bright red blood he experienced yesterday and today is consistent with his previous episodes of bleeding due to hemorrhoids.  Past Medical History:  Diagnosis Date  . Migraine   . Seizures [facility])     Patient Active Problem List   Diagnosis Date Noted  . 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 none      Prior to Admission medications   Medication Sig Start Date End Date Taking? Authorizing Provider  albuterol (PROVENTIL HFA;VENTOLIN HFA) 108 (90 Base) MCG/ACT inhaler Inhale 2 puffs into the lungs every 6 (six) hours as needed for wheezing or shortness of breath. 07/08/18   Willy Eddy, MD  Aspirin-Caffeine (BAYER BACK & BODY) 500-32.5 MG TABS Take 2 tablets by mouth.    [provider]  hydrocortisone (ANUSOL-HC) 25 MG suppository Place 1 suppository (25 mg total) rectally every 12 (twelve) hours for 6 days. 11/30/19 12/06/19  Lin Hackmann, Charlesetta Ivory, PA-C  omeprazole (PRILOSEC) 40 MG capsule Take 1 capsule (40 mg total) by mouth daily. 11/30/19 01/29/20  Marcie Shearon, Charlesetta Ivory, PA-C  ondansetron (ZOFRAN ODT) 4 MG disintegrating tablet Take 1 tablet (4 mg  total) by mouth every 8 (eight) hours as needed. 11/30/19   Ravan Schlemmer, Charlesetta Ivory, PA-C  phenytoin (DILANTIN) 300 MG ER capsule Take 1 capsule (300 mg total) by mouth at bedtime. Patient not taking: Reported on 04/10/2016 08/01/15   Enedina Finner, MD    Allergies Patient has no known allergies.  Family History  Problem Relation Age of Onset  . Diabetes Mellitus II Mother     Social History Social History   Tobacco Use  . Smoking status: Current Some Day Smoker    Packs/day: 0.50    Types: Cigarettes  . Smokeless tobacco: Never Used  Substance Use Topics  . Alcohol use: No  . Drug use: No    Review of Systems  Constitutional: Negative for fever. Cardiovascular: Negative for chest pain. Respiratory: Negative for shortness of breath. Gastrointestinal: Positive for abdominal pain, vomiting and diarrhea. Genitourinary: Negative for dysuria. Musculoskeletal: Negative for back pain. Skin: Negative for rash. Neurological: Negative for headaches, focal weakness or numbness. ____________________________________________  PHYSICAL EXAM:  VITAL SIGNS: ED Triage Vitals  Enc Vitals Group     BP 11/30/19 1357 (!) 145/88     Pulse Rate 11/30/19 1357 67     Resp 11/30/19 1357 18     Temp 11/30/19 1357 99 F (37.2 C)     Temp src --      SpO2 11/30/19 1357 98 %     Weight 11/30/19 1355 270 lb (122.5 kg)     Height 11/30/19 1355 6\' 1"  (1.854  m)     Head Circumference --      Peak Flow --      Pain Score 11/30/19 1355 6     Pain Loc --      Pain Edu? --      Excl. in [place]? --     Constitutional: Alert and oriented. Well appearing and in no distress. Head: Normocephalic and atraumatic. Eyes: Conjunctivae are normal. Normal extraocular movements Cardiovascular: Normal rate, regular rhythm. Normal distal pulses. Respiratory: Normal respiratory effort. No wheezes/rales/rhonchi. Gastrointestinal: Soft and nontender. No distention, rebound, guarding, or rigidity. Normal bowel sounds  noted. Rectal exam deferred.  Musculoskeletal: Nontender with normal range of motion in all extremities.  Neurologic:  Normal gait without ataxia. Normal speech and language. No gross focal neurologic deficits are appreciated. Skin:  Skin is warm, dry and intact. No rash noted. Psychiatric: Mood and affect are normal. Patient exhibits appropriate insight and judgment. ____________________________________________   LABS (pertinent positives/negatives) Labs Reviewed  COMPREHENSIVE METABOLIC PANEL - Abnormal; Notable for the following components:      Result Value   Glucose, Bld 160 (*)    Creatinine, Ser 1.33 (*)    ALT 55 (*)    Total Bilirubin 1.3 (*)    All other components within normal limits  LIPASE, BLOOD  CBC  ____________________________________________  PROCEDURES  Zofran 4 mg ODT GI Cocktail w/ Lido  Procedures ____________________________________________  INITIAL IMPRESSION / ASSESSMENT AND PLAN / ED COURSE  Patient with ED evaluation of nausea, vomiting, and loose stools. He reports stable symptoms at this time. His labs and exam are normal and reassuring at this time. He will be discharged with prescriptions for omeprazole, Zofran ,and Anusol suppositories. He is referred to the [facility] for routine medical care. Return precautions are reviewed.   Ricky Alvarado was evaluated in Emergency Department on 11/30/2019 for the symptoms described in the history of present illness. He was evaluated in the context of the global COVID-19 pandemic, which necessitated consideration that the patient might be at risk for infection with the SARS-CoV-2 virus that causes COVID-19. Institutional protocols and algorithms that pertain to the evaluation of patients at risk for COVID-19 are in a state of rapid change based on information released by regulatory bodies including the CDC and federal and state organizations. These policies and algorithms were followed during the  patient's care in the ED. ____________________________________________  FINAL CLINICAL IMPRESSION(S) / ED DIAGNOSES  Final diagnoses:  Nausea vomiting and diarrhea  Hemorrhoids, unspecified hemorrhoid type      Lakai Moree, Dannielle Karvonen, PA-C 11/30/19 2336    Merlyn Lot, MD 11/30/19 [phone]

## 2020-03-01 ENCOUNTER — Telehealth: Payer: Self-pay | Admitting: General Practice

## 2020-03-01 NOTE — Telephone Encounter (Signed)
Individual has been contacted 3+ times regarding ED referral. No further attempts to contact individual will be made.Individual has been contacted 3+ times regarding ED referral. No further attempts to contact individual will be made.

## 2020-08-02 ENCOUNTER — Other Ambulatory Visit: Payer: Self-pay

## 2020-08-02 ENCOUNTER — Emergency Department: Payer: Self-pay

## 2020-08-02 ENCOUNTER — Emergency Department
Admission: EM | Admit: 2020-08-02 | Discharge: 2020-08-02 | Disposition: A | Discharge status: Home / Self Care | Payer: Self-pay | Attending: Emergency Medicine | Admitting: Emergency Medicine

## 2020-08-02 ENCOUNTER — Encounter: Payer: Self-pay | Admitting: *Deleted

## 2020-08-02 DIAGNOSIS — Z20822 Contact with and (suspected) exposure to covid-19: Secondary | ICD-10-CM | POA: Insufficient documentation

## 2020-08-02 DIAGNOSIS — Z7982 Long term (current) use of aspirin: Secondary | ICD-10-CM | POA: Insufficient documentation

## 2020-08-02 DIAGNOSIS — J069 Acute upper respiratory infection, unspecified: Secondary | ICD-10-CM

## 2020-08-02 DIAGNOSIS — F17210 Nicotine dependence, cigarettes, uncomplicated: Secondary | ICD-10-CM | POA: Insufficient documentation

## 2020-08-02 LAB — RESP PANEL BY RT-PCR (FLU A&B, COVID) ARPGX2
Influenza A by PCR: NEGATIVE
Influenza B by PCR: NEGATIVE
SARS Coronavirus 2 by RT PCR: NEGATIVE

## 2020-08-02 MED ORDER — BENZONATATE 100 MG PO CAPS: 100.0000 mg | ORAL_CAPSULE | Freq: Three times a day (TID) | ORAL | 0 refills | Status: DC | PRN

## 2020-08-02 NOTE — ED Triage Notes (Signed)
Pt to ED reporting recent COVID exposure. Pt now having a cough, fevers, body aches and congestions. Pt has been taking OTC medications at home for fever.

## 2020-08-03 NOTE — ED Provider Notes (Signed)
[facility] Emergency Department Provider Note  ____________________________________________   Event Date/Time   First MD Initiated Contact with Patient 08/02/20 2042     (approximate)  I have reviewed the triage vital signs and the nursing notes.   HISTORY  Chief Complaint Cough  HPI Ricky Alvarado is a 29 y.o. male who presents to the emergency department for evaluation of flulike symptoms.  The patient reports that he had a recent and Covid exposure and the day after exposure to that individual, began developing symptoms.  He reports that he has been having a cough, fever and body ache over the last few days.  He has been taking over-the-counter cold and flu medications without relief.  He denies any chest pain, shortness of breath, hemoptysis.  No GI upset, nausea vomiting diarrhea or abdominal pain.  He denies any other associated symptoms.         Past Medical History:  Diagnosis Date   Migraine    Seizures ([facility]CC)     Patient Active Problem List   Diagnosis Date Noted   Meningitis 07/31/2015    Past Surgical History:  Procedure Laterality Date   none      Prior to Admission medications   Medication Sig Start Date End Date Taking? Authorizing Provider  albuterol (PROVENTIL HFA;VENTOLIN HFA) 108 (90 Base) MCG/ACT inhaler Inhale 2 puffs into the lungs every 6 (six) hours as needed for wheezing or shortness of breath. 07/08/18   Willy Eddy, MD  Aspirin-Caffeine (BAYER BACK & BODY) 500-32.5 MG TABS Take 2 tablets by mouth.    [provider]  benzonatate (TESSALON PERLES) 100 MG capsule Take 1 capsule (100 mg total) by mouth 3 (three) times daily as needed for cough. 08/02/20 08/02/21  Lucy Chris, PA  omeprazole (PRILOSEC) 40 MG capsule Take 1 capsule (40 mg total) by mouth daily. 11/30/19 01/29/20  Menshew, Charlesetta Ivory, PA-C  ondansetron (ZOFRAN ODT) 4 MG disintegrating tablet Take 1 tablet (4 mg total) by mouth  every 8 (eight) hours as needed. 11/30/19   Menshew, Charlesetta Ivory, PA-C  phenytoin (DILANTIN) 300 MG ER capsule Take 1 capsule (300 mg total) by mouth at bedtime. Patient not taking: Reported on 04/10/2016 08/01/15   Enedina Finner, MD    Allergies Patient has no known allergies.  Family History  Problem Relation Age of Onset   Diabetes Mellitus II Mother     Social History Social History   Tobacco Use   Smoking status: Current Some Day Smoker    Packs/day: 0.50    Types: Cigarettes   Smokeless tobacco: Never Used  Substance Use Topics   Alcohol use: No   Drug use: No    Review of Systems Constitutional: + fever/chills Eyes: No visual changes. ENT: + Nasal congestion, no sore throat. Cardiovascular: Denies chest pain. Respiratory: + Cough, denies shortness of breath. Gastrointestinal: No abdominal pain.  No nausea, no vomiting.  No diarrhea.  No constipation. Genitourinary: Negative for dysuria. Musculoskeletal: Negative for back pain. Skin: Negative for rash. Neurological: Negative for headaches, focal weakness or numbness.   ____________________________________________   PHYSICAL EXAM:  VITAL SIGNS: ED Triage Vitals  Enc Vitals Group     BP 08/02/20 1949 135/78     Pulse Rate 08/02/20 1949 77     Resp 08/02/20 1949 18     Temp 08/02/20 1949 98.9 F (37.2 C)     Temp Source 08/02/20 1949 Oral     SpO2 08/02/20 1949  97 %     Weight 08/02/20 1950 265 lb (120.2 kg)     Height 08/02/20 1950 6\' 1"  (1.854 m)     Head Circumference --      Peak Flow --      Pain Score 08/02/20 2002 7     Pain Loc --      Pain Edu? --      Excl. in [place]? --    Constitutional: Alert and oriented. Well appearing and in no acute distress. Eyes: Conjunctivae are normal. PERRL. EOMI. Head: Atraumatic. Nose: Mild congestion/rhinnorhea. Mouth/Throat: Mucous membranes are moist.  Oropharynx non-erythematous.  No tonsillar enlargement or exudate. Neck: No stridor.    Cardiovascular: Normal rate, regular rhythm. Grossly normal heart sounds.  Good peripheral circulation. Respiratory: Normal respiratory effort.  No retractions. Lungs CTAB. Gastrointestinal: Soft and nontender. No distention. No abdominal bruits. No CVA tenderness. Musculoskeletal: No lower extremity tenderness nor edema.  No joint effusions. Neurologic:  Normal speech and language. No gross focal neurologic deficits are appreciated. No gait instability. Skin:  Skin is warm, dry and intact. No rash noted. Psychiatric: Mood and affect are normal. Speech and behavior are normal.  ____________________________________________   LABS (all labs ordered are listed, but only abnormal results are displayed)  Labs Reviewed  RESP PANEL BY RT-PCR (FLU A&B, COVID) [id number]   ____________________________________________  RADIOLOGY I, 08/04/20, personally viewed and evaluated these images (plain radiographs) as part of my medical decision making, as well as reviewing the written report by the radiologist.  ED provider interpretation: No focal pneumonia appreciated  Official radiology report(s): DG Chest Portable 1 View  Result Date: 08/02/2020 CLINICAL DATA:  Cough, fever, body aches, COVID-19 exposure EXAM: PORTABLE CHEST 1 VIEW COMPARISON:  07/07/2018 FINDINGS: The heart size and mediastinal contours are within normal limits. Both lungs are clear. The visualized skeletal structures are unremarkable. IMPRESSION: No active disease. Electronically Signed   By: 07/09/2018 M.D.   On: 08/02/2020 21:38     ____________________________________________   INITIAL IMPRESSION / ASSESSMENT AND PLAN / ED COURSE  As part of my medical decision making, I reviewed the following data within the electronic MEDICAL RECORD NUMBER Nursing notes reviewed and incorporated, Labs reviewed and Radiograph reviewed        Patient is a 29 year old male who presents emergency department for evaluation of  flulike symptoms after exposure to Covid.  Patient reports he needs a Covid test and is simply concerned about exposure to this.  See HPI for further details.  On physical exam, the patient does have mild nasal congestion, however there is no erythema of his throat, heart and lung auscultations are within normal limits.  Given the patient's fever and cough complaint, chest x-ray was obtained which is negative for any acute focal pneumonia.  Respiratory panel to include Covid and flu were obtained and are negative.  Discussed symptomatic treatment for a viral URI with the patient.  He is amenable with this plan.  Will prescribe Tessalon Perles for his cough and recommended rotating Tylenol and ibuprofen for his fever, body aches.  Patient will have follow-up with primary care, or will return to the emergency department for any acute worsening.      ____________________________________________   FINAL CLINICAL IMPRESSION(S) / ED DIAGNOSES  Final diagnoses:  Viral URI with cough     ED Discharge Orders         Ordered    benzonatate (TESSALON PERLES) 100 MG capsule  3 times daily  PRN        08/02/20 2147          *Please note:  Ricky Alvarado was evaluated in Emergency Department on 08/03/2020 for the symptoms described in the history of present illness. He was evaluated in the context of the global COVID-19 pandemic, which necessitated consideration that the patient might be at risk for infection with the SARS-CoV-2 virus that causes COVID-19. Institutional protocols and algorithms that pertain to the evaluation of patients at risk for COVID-19 are in a state of rapid change based on information released by regulatory bodies including the CDC and federal and state organizations. These policies and algorithms were followed during the patient's care in the ED.  Some ED evaluations and interventions may be delayed as a result of limited staffing during and the pandemic.*   Note:  This  document was prepared using Dragon voice recognition software and may include unintentional dictation errors.    Lucy Chris, PA 08/03/20 1615    Arnaldo Natal, MD 08/04/20 0630

## 2020-10-15 ENCOUNTER — Encounter: Payer: Self-pay | Admitting: Emergency Medicine

## 2020-10-15 ENCOUNTER — Emergency Department
Admission: EM | Admit: 2020-10-15 | Discharge: 2020-10-15 | Disposition: A | Discharge status: Home / Self Care | Payer: Self-pay | Attending: Emergency Medicine | Admitting: Emergency Medicine

## 2020-10-15 ENCOUNTER — Other Ambulatory Visit: Payer: Self-pay

## 2020-10-15 ENCOUNTER — Emergency Department: Payer: Self-pay

## 2020-10-15 DIAGNOSIS — S63502A Unspecified sprain of left wrist, initial encounter: Secondary | ICD-10-CM | POA: Insufficient documentation

## 2020-10-15 DIAGNOSIS — Y92009 Unspecified place in unspecified non-institutional (private) residence as the place of occurrence of the external cause: Secondary | ICD-10-CM | POA: Insufficient documentation

## 2020-10-15 DIAGNOSIS — Z7982 Long term (current) use of aspirin: Secondary | ICD-10-CM | POA: Insufficient documentation

## 2020-10-15 DIAGNOSIS — F17210 Nicotine dependence, cigarettes, uncomplicated: Secondary | ICD-10-CM | POA: Insufficient documentation

## 2020-10-15 DIAGNOSIS — W010XXA Fall on same level from slipping, tripping and stumbling without subsequent striking against object, initial encounter: Secondary | ICD-10-CM | POA: Insufficient documentation

## 2020-10-15 DIAGNOSIS — W19XXXA Unspecified fall, initial encounter: Secondary | ICD-10-CM

## 2020-10-15 MED ORDER — IBUPROFEN 800 MG PO TABS: 800.0000 mg | ORAL_TABLET | Freq: Three times a day (TID) | ORAL | 0 refills | Status: DC | PRN

## 2020-10-15 MED ORDER — TRAMADOL HCL 50 MG PO TABS: 50.0000 mg | ORAL_TABLET | Freq: Four times a day (QID) | ORAL | 0 refills | Status: DC | PRN

## 2020-10-15 MED ORDER — IBUPROFEN 600 MG PO TABS
600.0000 mg | ORAL_TABLET | Freq: Once | ORAL | Status: AC
  Administered 2020-10-15: 600 mg via ORAL
  Filled 2020-10-15: qty 1

## 2020-10-15 NOTE — ED Provider Notes (Signed)
[facility] Emergency Department Provider Note  ____________________________________________   Event Date/Time   First MD Initiated Contact with Patient 10/15/20 0715     (approximate)  I have reviewed the triage vital signs and the nursing notes.   HISTORY  Chief Complaint Wrist Injury   HPI Ricky Alvarado is a 30 y.o. male presents to the ED after he fell in the shower this morning injuring his left wrist.  He denies any head injury or loss of consciousness.  He has not taken any over-the-counter medication since his fall.      Past Medical History:  Diagnosis Date  . Migraine   . Seizures [facility])     Patient Active Problem List   Diagnosis Date Noted  . 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 none      Prior to Admission medications   Medication Sig Start Date End Date Taking? Authorizing Provider  ibuprofen (ADVIL) 800 MG tablet Take 1 tablet (800 mg total) by mouth every 8 (eight) hours as needed. 10/15/20  Yes Tommi Rumps, PA-C  traMADol (ULTRAM) 50 MG tablet Take 1 tablet (50 mg total) by mouth every 6 (six) hours as needed. 10/15/20  Yes Bridget Hartshorn L, PA-C  albuterol (PROVENTIL HFA;VENTOLIN HFA) 108 (90 Base) MCG/ACT inhaler Inhale 2 puffs into the lungs every 6 (six) hours as needed for wheezing or shortness of breath. 07/08/18   Willy Eddy, MD  Aspirin-Caffeine (BAYER BACK & BODY) 500-32.5 MG TABS Take 2 tablets by mouth.    [provider]  omeprazole (PRILOSEC) 40 MG capsule Take 1 capsule (40 mg total) by mouth daily. 11/30/19 01/29/20  Menshew, Charlesetta Ivory, PA-C  phenytoin (DILANTIN) 300 MG ER capsule Take 1 capsule (300 mg total) by mouth at bedtime. Patient not taking: Reported on 04/10/2016 08/01/15   Enedina Finner, MD    Allergies Patient has no known allergies.  Family History  Problem Relation Age of Onset  . Diabetes Mellitus II Mother     Social  History Social History   Tobacco Use  . Smoking status: Current Some Day Smoker    Packs/day: 0.50    Types: Cigarettes  . Smokeless tobacco: Never Used  Substance Use Topics  . Alcohol use: No  . Drug use: No    Review of Systems Constitutional: No fever/chills Eyes: No visual changes. ENT: No trauma. Cardiovascular: Denies chest pain. Respiratory: Denies shortness of breath. Gastrointestinal: No abdominal pain.  No nausea, no vomiting.  Genitourinary: Negative for dysuria. Musculoskeletal: Positive for left wrist pain. Skin: Negative for rash. Neurological: Negative for headaches, focal weakness or numbness. ____________________________________________   PHYSICAL EXAM:  VITAL SIGNS: ED Triage Vitals [10/15/20 0709]  Enc Vitals Group     BP (!) 147/93     Pulse Rate 65     Resp 18     Temp 98.4 F (36.9 C)     Temp Source Oral     SpO2 97 %     Weight 260 lb (117.9 kg)     Height      Head Circumference      Peak Flow      Pain Score      Pain Loc      Pain Edu?      Excl. in [place]?     Constitutional: Alert and oriented. Well appearing and in no acute distress. Eyes: Conjunctivae are normal.  Head: Atraumatic. Neck: No  stridor.   Cardiovascular: Normal rate, regular rhythm. Grossly normal heart sounds.  Good peripheral circulation. Respiratory: Normal respiratory effort.  No retractions. Lungs CTAB. Musculoskeletal: Examination of the left wrist there is no gross deformity however there is soft tissue edema and marked tenderness to palpation.  Patient is able to flex and extend his wrist slowly due to discomfort.  He also is able to move digits distally and motor sensory function intact.  Radial pulses present.  Skin is intact.  Cap refills less than 3 seconds.  No tenderness is noted on palpation of the cervical, thoracic or lumbar spine.  Patient is ambulatory without any assistance. Neurologic:  Normal speech and language. No gross focal neurologic deficits  are appreciated. No gait instability. Skin:  Skin is warm, dry and intact.  No ecchymosis or abrasions were seen. Psychiatric: Mood and affect are normal. Speech and behavior are normal.  ____________________________________________   LABS (all labs ordered are listed, but only abnormal results are displayed)  Labs Reviewed - No data to display   RADIOLOGY I, Tommi Rumps, personally viewed and evaluated these images (plain radiographs) as part of my medical decision making, as well as reviewing the written report by the radiologist.   Official radiology report(s): DG Wrist Complete Left  Result Date: 10/15/2020 CLINICAL DATA:  Acute LEFT wrist pain following fall. Initial encounter. EXAM: LEFT WRIST - COMPLETE 3+ VIEW COMPARISON:  None. FINDINGS: There is no evidence of fracture or dislocation. There is no evidence of arthropathy or other focal bone abnormality. Soft tissues are unremarkable. IMPRESSION: Negative. Electronically Signed   By: Harmon Pier M.D.   On: 10/15/2020 08:10    ____________________________________________   PROCEDURES  Procedure(s) performed (including Critical Care):  Procedures Jones wrap to left wrist was applied by provider.  ____________________________________________   INITIAL IMPRESSION / ASSESSMENT AND PLAN / ED COURSE  As part of my medical decision making, I reviewed the following data within the electronic MEDICAL RECORD NUMBER Notes from prior ED visits and [place] Controlled Substance Database  30 year old male presents to the ED with complaint of left wrist pain after he fell in the shower this morning.  He denies any head injury or loss of consciousness.  Patient has continued to have wrist pain.  He did not take any over-the-counter medication prior to arrival.  Currently has an ice bag on it.  X-rays are negative for acute bony injury.  Patient was made aware.  He was placed and a Jones wrap and encouraged to ice and elevate today.  A  prescription for ibuprofen 800 mg every 8 hours with food and tramadol was sent to his pharmacy.  He is to follow-up with Dr. Odis Luster who is on-call for orthopedics if he continues to have problems with his wrist. ____________________________________________   FINAL CLINICAL IMPRESSION(S) / ED DIAGNOSES  Final diagnoses:  Wrist sprain, left, initial encounter  Fall at home, initial encounter     ED Discharge Orders         Ordered    ibuprofen (ADVIL) 800 MG tablet  Every 8 hours PRN        10/15/20 0822    traMADol (ULTRAM) 50 MG tablet  Every 6 hours PRN        10/15/20 7829          *Please note:  Ricky Alvarado was evaluated in Emergency Department on 10/15/2020 for the symptoms described in the history of present illness. He was evaluated in the context  of the global COVID-19 pandemic, which necessitated consideration that the patient might be at risk for infection with the SARS-CoV-2 virus that causes COVID-19. Institutional protocols and algorithms that pertain to the evaluation of patients at risk for COVID-19 are in a state of rapid change based on information released by regulatory bodies including the CDC and federal and state organizations. These policies and algorithms were followed during the patient's care in the ED.  Some ED evaluations and interventions may be delayed as a result of limited staffing during and the pandemic.*   Note:  This document was prepared using Dragon voice recognition software and may include unintentional dictation errors.    Tommi Rumps, PA-C 10/15/20 1323    Dionne Bucy, MD 10/15/20 [phone]

## 2020-10-15 NOTE — ED Triage Notes (Signed)
Pt in after slip fall out of shower an hour ago. States he landed onto his L side, w/L wrist underneath him. Swelling and obvious deformity noted lateral wrist. Able to move fingers slightly

## 2020-10-15 NOTE — Discharge Instructions (Signed)
Ice and elevate wrist to help with pain and swelling.  Begin taking ibuprofen 800 mg every 8 hours with food.  Tramadol if needed for severe pain.  Do not drive or operate machinery while taking the tramadol as it could cause drowsiness and increase your risk for injury.  If not improving you may want to follow-up with an orthopedist locally who would be Dr. Odis Luster over at [facility].  You may also follow-up with your PCP at the [facility] hospital if needed.

## 2020-12-03 ENCOUNTER — Emergency Department
Admission: EM | Admit: 2020-12-03 | Discharge: 2020-12-03 | Disposition: A | Discharge status: Home / Self Care | Payer: Self-pay | Attending: Emergency Medicine | Admitting: Emergency Medicine

## 2020-12-03 ENCOUNTER — Other Ambulatory Visit: Payer: Self-pay

## 2020-12-03 DIAGNOSIS — F17210 Nicotine dependence, cigarettes, uncomplicated: Secondary | ICD-10-CM | POA: Insufficient documentation

## 2020-12-03 DIAGNOSIS — G40909 Epilepsy, unspecified, not intractable, without status epilepticus: Secondary | ICD-10-CM | POA: Insufficient documentation

## 2020-12-03 DIAGNOSIS — U071 COVID-19: Secondary | ICD-10-CM | POA: Insufficient documentation

## 2020-12-03 LAB — POC SARS CORONAVIRUS 2 AG -  ED: SARSCOV2ONAVIRUS 2 AG: POSITIVE — AB

## 2020-12-03 MED ORDER — NAPROXEN 500 MG PO TABS: 500.0000 mg | ORAL_TABLET | Freq: Two times a day (BID) | ORAL | 0 refills | Status: DC

## 2020-12-03 MED ORDER — LEVETIRACETAM 500 MG PO TABS: 500.0000 mg | ORAL_TABLET | Freq: Two times a day (BID) | ORAL | 1 refills | Status: AC

## 2020-12-03 MED ORDER — ONDANSETRON 4 MG PO TBDP: 4.0000 mg | ORAL_TABLET | Freq: Three times a day (TID) | ORAL | 0 refills | Status: DC | PRN

## 2020-12-03 MED ORDER — PAXLOVID 20 X 150 MG & 10 X 100MG PO TBPK: 3.0000 | ORAL_TABLET | Freq: Two times a day (BID) | ORAL | 0 refills | Status: AC

## 2020-12-03 NOTE — ED Triage Notes (Addendum)
Patient comes in for HTN. Patient advised past 3 days having nosebleeds and headache, N/v, diarrhea. patient advised he hasnt had an appetite. No history of HTN. Patient is positive exposure. All of his family tested positive yesterday for COVID. They were all together on easter Sunday. Patient wants to be COVID tested.

## 2020-12-03 NOTE — ED Provider Notes (Signed)
[facility] Emergency Department Provider Note  ____________________________________________  Time seen: Approximately 1:00 PM  I have reviewed the triage vital signs and the nursing notes.   HISTORY  Chief Complaint Hypertension    HPI Ricky Alvarado is a 30 y.o. male with a history of seizure disorder off antiepileptics for at least the past year who comes the ED complaining of intermittent nosebleed, generalized headache, nausea vomiting diarrhea, decreased oral intake, chills, body aches, fatigue.  This is been going for the past 3 days.  Multiple household family members have tested positive for COVID recently.  Wants to be tested for COVID.  He is not eating solid foods lunch but is drinking plenty of fluids and feels that he staying adequately hydrated.  Pain is not severe.  Denies any significant shortness of breath.  No recent seizures, but is interested in restarting antiepileptic medicine.  Unsure what he was taking in the past.      Past Medical History:  Diagnosis Date  . Migraine   . Seizures [facility])      Patient Active Problem List   Diagnosis Date Noted  . Meningitis 07/31/2015     Past Surgical History:  Procedure Laterality Date  . none       Prior to Admission medications   Medication Sig Start Date End Date Taking? Authorizing Provider  levETIRAcetam (KEPPRA) 500 MG tablet Take 1 tablet (500 mg total) by mouth 2 (two) times daily. 12/03/20  Yes Sharman Cheek, MD  naproxen (NAPROSYN) 500 MG tablet Take 1 tablet (500 mg total) by mouth 2 (two) times daily with a meal. 12/03/20  Yes Sharman Cheek, MD  Nirmatrelvir & Ritonavir (PAXLOVID) 20 x 150 MG & 10 x 100MG  TBPK Take 3 tablets by mouth in the morning and at bedtime for 5 days. Take according to package instructions 12/03/20 12/08/20 Yes 12/10/20, MD  ondansetron (ZOFRAN ODT) 4 MG disintegrating tablet Take 1 tablet (4 mg total) by mouth every 8 (eight) hours as  needed for nausea or vomiting. 12/03/20  Yes 12/05/20, MD  albuterol (PROVENTIL HFA;VENTOLIN HFA) 108 (90 Base) MCG/ACT inhaler Inhale 2 puffs into the lungs every 6 (six) hours as needed for wheezing or shortness of breath. 07/08/18   07/10/18, MD  Aspirin-Caffeine (BAYER BACK & BODY) 500-32.5 MG TABS Take 2 tablets by mouth.    [provider]  ibuprofen (ADVIL) 800 MG tablet Take 1 tablet (800 mg total) by mouth every 8 (eight) hours as needed. 10/15/20   12/15/20, PA-C  omeprazole (PRILOSEC) 40 MG capsule Take 1 capsule (40 mg total) by mouth daily. 11/30/19 01/29/20  Menshew, 01/31/20, PA-C  traMADol (ULTRAM) 50 MG tablet Take 1 tablet (50 mg total) by mouth every 6 (six) hours as needed. 10/15/20   12/15/20, PA-C  phenytoin (DILANTIN) 300 MG ER capsule Take 1 capsule (300 mg total) by mouth at bedtime. Patient not taking: Reported on 04/10/2016 08/01/15 12/03/20  12/05/20, MD     Allergies Patient has no known allergies.   Family History  Problem Relation Age of Onset  . Diabetes Mellitus II Mother     Social History Social History   Tobacco Use  . Smoking status: Current Some Day Smoker    Packs/day: 0.50    Types: Cigarettes  . Smokeless tobacco: Never Used  Substance Use Topics  . Alcohol use: No  . Drug use: No    Review of Systems  Constitutional:   No fever positive chills.  ENT:   Positive sore throat. No rhinorrhea. Cardiovascular:   No chest pain or syncope. Respiratory:   No dyspnea positive cough. Gastrointestinal:   Negative for abdominal pain, positive vomiting and diarrhea.  Musculoskeletal:   Negative for focal pain or swelling All other systems reviewed and are negative except as documented above in ROS and HPI.  ____________________________________________   PHYSICAL EXAM:  VITAL SIGNS: ED Triage Vitals  Enc Vitals Group     BP 12/03/20 1015 (!) 152/88     Pulse Rate 12/03/20 1015 79     Resp  12/03/20 1015 20     Temp 12/03/20 1015 98.6 F (37 C)     Temp Source 12/03/20 1015 Oral     SpO2 12/03/20 1015 98 %     Weight 12/03/20 1015 274 lb (124.3 kg)     Height 12/03/20 1015 6\' 1"  (1.854 m)     Head Circumference --      Peak Flow --      Pain Score 12/03/20 1035 5     Pain Loc --      Pain Edu? --      Excl. in GC? --     Vital signs reviewed, nursing assessments reviewed.   Constitutional:   Alert and oriented. Non-toxic appearance. Eyes:   Conjunctivae are normal. EOMI. PERRL. ENT      Head:   Normocephalic and atraumatic.      Nose:   Wearing a mask.      Mouth/Throat:   Wearing a mask.      Neck:   No meningismus. Full ROM. Hematological/Lymphatic/Immunilogical:   No cervical lymphadenopathy. Cardiovascular:   RRR. Symmetric bilateral radial and DP pulses.  No murmurs. Cap refill less than 2 seconds. Respiratory:   Normal respiratory effort without tachypnea/retractions.  Slight basilar crackles.  No wheezing.  Symmetric air movement Gastrointestinal:   Soft and nontender. Non distended. There is no CVA tenderness.  No rebound, rigidity, or guarding. Genitourinary:   deferred Musculoskeletal:   Normal range of motion in all extremities. No joint effusions.  No lower extremity tenderness.  No edema. Neurologic:   Normal speech and language.  Motor grossly intact. No acute focal neurologic deficits are appreciated.  Skin:    Skin is warm, dry and intact. No rash noted.  No petechiae, purpura, or bullae.  ____________________________________________    LABS (pertinent positives/negatives) (all labs ordered are listed, but only abnormal results are displayed) Labs Reviewed  POC SARS CORONAVIRUS 2 AG -  ED - Abnormal; Notable for the following components:      Result Value   SARSCOV2ONAVIRUS 2 AG POSITIVE (*)    All other components within normal limits    ____________________________________________   EKG    ____________________________________________    RADIOLOGY  No results found.  ____________________________________________   PROCEDURES Procedures  ____________________________________________    CLINICAL IMPRESSION / ASSESSMENT AND PLAN / ED COURSE  Medications ordered in the ED: Medications - No data to display  Pertinent labs & imaging results that were available during my care of the patient were reviewed by me and considered in my medical decision making (see chart for details).  Jahred L Snelgrove was evaluated in Emergency Department on 12/03/2020 for the symptoms described in the history of present illness. He was evaluated in the context of the global COVID-19 pandemic, which necessitated consideration that the patient might be at risk for infection with the SARS-CoV-2 virus that  causes COVID-19. Institutional protocols and algorithms that pertain to the evaluation of patients at risk for COVID-19 are in a state of rapid change based on information released by regulatory bodies including the CDC and federal and state organizations. These policies and algorithms were followed during the patient's care in the ED.   Patient presents with influenza-like illness, multiple household contacts with COVID.  COVID rapid antigen here is positive.  Will prescribe Paxlovid, Aleve and Zofran for supportive care.  Keppra for epilepsy.  Recommend follow-up with neurology and primary care.      ____________________________________________   FINAL CLINICAL IMPRESSION(S) / ED DIAGNOSES    Final diagnoses:  COVID-19 virus infection  Nonintractable epilepsy without status epilepticus, unspecified epilepsy type [facility])     ED Discharge Orders         Ordered    Nirmatrelvir & Ritonavir (PAXLOVID) 20 x 150 MG & 10 x 100MG  TBPK  2 times daily        12/03/20 1259    ondansetron (ZOFRAN ODT) 4 MG disintegrating tablet  Every  8 hours PRN        12/03/20 1259    naproxen (NAPROSYN) 500 MG tablet  2 times daily with meals        12/03/20 1259    levETIRAcetam (KEPPRA) 500 MG tablet  2 times daily        12/03/20 1259          Portions of this note were generated with dragon dictation software. Dictation errors may occur despite best attempts at proofreading.   12/05/20, MD 12/03/20 1302

## 2022-03-07 ENCOUNTER — Emergency Department
Admission: EM | Admit: 2022-03-07 | Discharge: 2022-03-07 | Disposition: A | Discharge status: Home / Self Care | Payer: 59 | Attending: Emergency Medicine | Admitting: Emergency Medicine

## 2022-03-07 ENCOUNTER — Other Ambulatory Visit: Payer: Self-pay

## 2022-03-07 ENCOUNTER — Encounter: Payer: Self-pay | Admitting: Emergency Medicine

## 2022-03-07 ENCOUNTER — Emergency Department: Payer: 59

## 2022-03-07 DIAGNOSIS — J45909 Unspecified asthma, uncomplicated: Secondary | ICD-10-CM | POA: Diagnosis not present

## 2022-03-07 DIAGNOSIS — Z20822 Contact with and (suspected) exposure to covid-19: Secondary | ICD-10-CM | POA: Diagnosis not present

## 2022-03-07 DIAGNOSIS — J189 Pneumonia, unspecified organism: Secondary | ICD-10-CM

## 2022-03-07 DIAGNOSIS — R519 Headache, unspecified: Secondary | ICD-10-CM | POA: Insufficient documentation

## 2022-03-07 DIAGNOSIS — R059 Cough, unspecified: Secondary | ICD-10-CM | POA: Diagnosis not present

## 2022-03-07 DIAGNOSIS — R509 Fever, unspecified: Secondary | ICD-10-CM | POA: Diagnosis not present

## 2022-03-07 DIAGNOSIS — J069 Acute upper respiratory infection, unspecified: Secondary | ICD-10-CM

## 2022-03-07 LAB — RESP PANEL BY RT-PCR (FLU A&B, COVID) ARPGX2
Influenza A by PCR: NEGATIVE
Influenza B by PCR: NEGATIVE
SARS Coronavirus 2 by RT PCR: NEGATIVE

## 2022-03-07 MED ORDER — IPRATROPIUM-ALBUTEROL 0.5-2.5 (3) MG/3ML IN SOLN
3.0000 mL | Freq: Once | RESPIRATORY_TRACT | Status: AC
  Administered 2022-03-07: 3 mL via RESPIRATORY_TRACT
  Filled 2022-03-07: qty 3

## 2022-03-07 MED ORDER — NIRMATRELVIR/RITONAVIR (PAXLOVID)TABLET: 3.0000 | ORAL_TABLET | Freq: Two times a day (BID) | ORAL | 0 refills | Status: AC

## 2022-03-07 MED ORDER — PREDNISONE 20 MG PO TABS
60.0000 mg | ORAL_TABLET | Freq: Once | ORAL | Status: AC
  Administered 2022-03-07: 60 mg via ORAL
  Filled 2022-03-07: qty 3

## 2022-03-07 MED ORDER — AZITHROMYCIN 250 MG PO TABS: ORAL_TABLET | ORAL | 0 refills | Status: AC

## 2022-03-07 MED ORDER — PREDNISONE 10 MG PO TABS: 10.0000 mg | ORAL_TABLET | Freq: Every day | ORAL | 0 refills | Status: DC

## 2022-03-07 NOTE — ED Notes (Signed)
C/O URI symptoms since Monday, worsening yesterday and today.  Tested positive for COVID today.  Patient with strong cough noted, C/O pain with cough.  Cough is non productive.  Also c/o body aches.

## 2022-03-07 NOTE — ED Provider Notes (Signed)
[facility] Provider Note    Event Date/Time   First MD Initiated Contact with Patient 03/07/22 1103     (approximate)  History   Chief Complaint: Cough and Chills  HPI  Ricky Alvarado is a 31 y.o. male with a past medical history of seizures, anxiety, asthma, presents to the emergency department with cough congestion testing positive for COVID this morning.  According to the patient for the last 2 days he has been coughing, headache, subjective fever/chills.  Patient states he took a COVID test this morning that was positive so he came to the emergency department for evaluation.  Patient states a history of asthma uses an albuterol nebulizer at home.  During my evaluation patient is coughing, currently satting 94% with a pulse oximeter on room air.  Physical Exam   Triage Vital Signs: ED Triage Vitals  Enc Vitals Group     BP 03/07/22 1043 (!) 146/89     Pulse Rate 03/07/22 1043 81     Resp 03/07/22 1041 16     Temp 03/07/22 1041 99.3 F (37.4 C)     Temp Source 03/07/22 1041 Oral     SpO2 03/07/22 1043 94 %     Weight 03/07/22 0953 266 lb (120.7 kg)     Height 03/07/22 0953 6\' 1"  (1.854 m)     Head Circumference --      Peak Flow --      Pain Score 03/07/22 0953 0     Pain Loc --      Pain Edu? --      Excl. in GC? --     Most recent vital signs: Vitals:   03/07/22 1041 03/07/22 1043  BP:  (!) 146/89  Pulse:  81  Resp: 16 16  Temp: 99.3 F (37.4 C)   SpO2:  94%    General: Awake, no distress.  CV:  Good peripheral perfusion.  Regular rate and rhythm  Resp:  Normal effort.  Occasional cough during examination patient does have mild to moderate expiratory wheezes. Abd:  No distention.  Soft, nontender.  No rebound or guarding.   RADIOLOGY  I have viewed and interpreted the chest x-ray images I do not see any acute abnormality on my evaluation. Radiology is read the chest x-ray as right basilar opacity may represent atelectasis  versus infection.   MEDICATIONS ORDERED IN ED: Medications  predniSONE (DELTASONE) tablet 60 mg (has no administration in time range)  ipratropium-albuterol (DUONEB) 0.5-2.5 (3) MG/3ML nebulizer solution 3 mL (has no administration in time range)  ipratropium-albuterol (DUONEB) 0.5-2.5 (3) MG/3ML nebulizer solution 3 mL (has no administration in time range)     IMPRESSION / MDM / ASSESSMENT AND PLAN / ED COURSE  I reviewed the triage vital signs and the nursing notes.  Patient's presentation is most consistent with acute presentation with potential threat to life or bodily function.  Patient presents to the emergency department for cough congestion shortness of breath subjective fever chills and a positive COVID test this morning.  We will confirm with a PCR COVID test we will obtain a chest x-ray given the patient's expiratory wheeze and cough.  As the patient is asthmatic now with presumed COVID we will dose to DuoNebs and 60 mg of prednisone.  Patient currently satting in the low 90s on room air we will reassess after nebulizer treatments.  Patient agreeable to plan of care.  Chest x-ray shows possible atelectasis versus infection.  COVID/flu test is  negative however patient states his COVID test this morning was positive.  Patient currently satting 95% on my recheck/reevaluation sounds better and states he is feeling better.  We will place the patient on prednisone taper in addition to Paxlovid and Zithromax.  Patient is to follow-up with his doctor.  Discussed return precautions.  Patient is also to continue using his home nebulizer every 4-6 hours as needed for shortness of breath.  FINAL CLINICAL IMPRESSION(S) / ED DIAGNOSES   COVID-19 Dyspnea    Note:  This document was prepared using Dragon voice recognition software and may include unintentional dictation errors.   Minna Antis, MD 03/07/22 1301

## 2022-09-27 DIAGNOSIS — H6992 Unspecified Eustachian tube disorder, left ear: Secondary | ICD-10-CM | POA: Insufficient documentation

## 2022-09-27 DIAGNOSIS — H9202 Otalgia, left ear: Secondary | ICD-10-CM | POA: Diagnosis not present

## 2022-09-27 DIAGNOSIS — Z1152 Encounter for screening for COVID-19: Secondary | ICD-10-CM | POA: Diagnosis not present

## 2022-09-28 ENCOUNTER — Encounter: Payer: Self-pay | Admitting: Emergency Medicine

## 2022-09-28 ENCOUNTER — Emergency Department
Admission: EM | Admit: 2022-09-28 | Discharge: 2022-09-28 | Disposition: A | Discharge status: Home / Self Care | Payer: 59 | Attending: Emergency Medicine | Admitting: Emergency Medicine

## 2022-09-28 DIAGNOSIS — H9202 Otalgia, left ear: Secondary | ICD-10-CM

## 2022-09-28 DIAGNOSIS — H6992 Unspecified Eustachian tube disorder, left ear: Secondary | ICD-10-CM

## 2022-09-28 LAB — RESP PANEL BY RT-PCR (RSV, FLU A&B, COVID)  RVPGX2
Influenza A by PCR: NEGATIVE
Influenza B by PCR: NEGATIVE
Resp Syncytial Virus by PCR: NEGATIVE
SARS Coronavirus 2 by RT PCR: NEGATIVE

## 2022-09-28 LAB — GROUP A STREP BY PCR: Group A Strep by PCR: NOT DETECTED

## 2022-09-28 MED ORDER — IBUPROFEN 800 MG PO TABS
800.0000 mg | ORAL_TABLET | Freq: Once | ORAL | Status: AC
  Administered 2022-09-28: 800 mg via ORAL
  Filled 2022-09-28: qty 1

## 2022-09-28 MED ORDER — HYDROCODONE-ACETAMINOPHEN 5-325 MG PO TABS: 1.0000 | ORAL_TABLET | Freq: Four times a day (QID) | ORAL | 0 refills | Status: DC | PRN

## 2022-09-28 MED ORDER — LIDOCAINE HCL (PF) 1 % IJ SOLN
2.0000 mL | Freq: Once | INTRAMUSCULAR | Status: AC
  Administered 2022-09-28: 2 mL
  Filled 2022-09-28: qty 5

## 2022-09-28 MED ORDER — PREDNISONE 20 MG PO TABS
30.0000 mg | ORAL_TABLET | Freq: Once | ORAL | Status: AC
  Administered 2022-09-28: 30 mg via ORAL
  Filled 2022-09-28: qty 1

## 2022-09-28 MED ORDER — METHYLPREDNISOLONE 4 MG PO TBPK: ORAL_TABLET | ORAL | 0 refills | Status: DC

## 2022-09-28 NOTE — Discharge Instructions (Signed)
1.  Take steroid taper as directed. 2.  You may take pain medicine as needed. 3.  You may check the results of your respiratory panel via MyChart. 4.  Return to the ER for worsening symptoms, persistent vomiting, difficulty breathing or other concerns.

## 2022-09-28 NOTE — ED Triage Notes (Signed)
Pt presents via POV with complaints of Left ear pain with associated sore throat. He states the pain started on his R ear last week - resolved and came back on the left side 2-3 days ago. Pt has tried tylenol with no improvement in his sx. Denies CP or SOB.

## 2022-10-04 NOTE — ED Provider Notes (Signed)
[facility] Provider Note    Event Date/Time   First MD Initiated Contact with Patient 09/28/22 0045     (approximate)   History   Otalgia and Sore Throat   HPI  Ricky Alvarado is a 32 y.o. male who presents to the ED from home with a chief complaint of left ear pain and sore throat.  Last week he had pain in his right ear which resolved.  Took Tylenol prior to arrival without improvement in symptoms.  Denies fever/chills, chest pain, shortness of breath, abdominal pain, nausea, vomiting, dizziness or discharge.     Past Medical History   Past Medical History:  Diagnosis Date   Migraine    Seizures ([place])      Active Problem List   Patient Active Problem List   Diagnosis Date Noted   Meningitis 07/31/2015     Past Surgical History   Past Surgical History:  Procedure Laterality Date   none       Home Medications   Prior to Admission medications   Medication Sig Start Date End Date Taking? Authorizing Provider  HYDROcodone-acetaminophen (NORCO) 5-325 MG tablet Take 1 tablet by mouth every 6 (six) hours as needed for moderate pain. 09/28/22  Yes Paulette Blanch, MD  methylPREDNISolone (MEDROL DOSEPAK) 4 MG TBPK tablet Take as directed 09/28/22  Yes Paulette Blanch, MD  albuterol (PROVENTIL HFA;VENTOLIN HFA) 108 (90 Base) MCG/ACT inhaler Inhale 2 puffs into the lungs every 6 (six) hours as needed for wheezing or shortness of breath. 07/08/18   Merlyn Lot, MD  Aspirin-Caffeine (BAYER BACK & BODY) 500-32.5 MG TABS Take 2 tablets by mouth.    [provider]  ibuprofen (ADVIL) 800 MG tablet Take 1 tablet (800 mg total) by mouth every 8 (eight) hours as needed. 10/15/20   Johnn Hai, PA-C  levETIRAcetam (KEPPRA) 500 MG tablet Take 1 tablet (500 mg total) by mouth 2 (two) times daily. 12/03/20   Carrie Mew, MD  naproxen (NAPROSYN) 500 MG tablet Take 1 tablet (500 mg total) by mouth 2 (two) times daily with a meal. 12/03/20    Carrie Mew, MD  omeprazole (PRILOSEC) 40 MG capsule Take 1 capsule (40 mg total) by mouth daily. 11/30/19 01/29/20  Menshew, Dannielle Karvonen, PA-C  ondansetron (ZOFRAN ODT) 4 MG disintegrating tablet Take 1 tablet (4 mg total) by mouth every 8 (eight) hours as needed for nausea or vomiting. 12/03/20   Carrie Mew, MD  predniSONE (DELTASONE) 10 MG tablet Take 1 tablet (10 mg total) by mouth daily. Day 1-3: take 4 tablets PO daily Day 4-6: take 3 tablets PO daily Day 7-9: take 2 tablets PO daily Day 10-12: take 1 tablet PO daily 03/07/22   Harvest Dark, MD  traMADol (ULTRAM) 50 MG tablet Take 1 tablet (50 mg total) by mouth every 6 (six) hours as needed. 10/15/20   Johnn Hai, PA-C  phenytoin (DILANTIN) 300 MG ER capsule Take 1 capsule (300 mg total) by mouth at bedtime. Patient not taking: Reported on 04/10/2016 08/01/15 12/03/20  Fritzi Mandes, MD     Allergies  Patient has no known allergies.   Family History   Family History  Problem Relation Age of Onset   Diabetes Mellitus II Mother      Physical Exam  Triage Vital Signs: ED Triage Vitals  Enc Vitals Group     BP 09/27/22 2358 (!) 143/81     Pulse Rate 09/27/22 2358 67  Resp 09/27/22 2358 18     Temp 09/27/22 2358 98.2 F (36.8 C)     Temp Source 09/27/22 2358 Oral     SpO2 09/27/22 2358 93 %     Weight 09/28/22 0002 265 lb (120.2 kg)     Height 09/28/22 0002 6' 1"$  (1.854 m)     Head Circumference --      Peak Flow --      Pain Score 09/28/22 0002 4     Pain Loc --      Pain Edu? --      Excl. in [place]? --     Updated Vital Signs: BP (!) 143/81 (BP Location: Left Arm)   Pulse 67   Temp 98.2 F (36.8 C) (Oral)   Resp 18   Ht 6' 1"$  (1.854 m)   Wt 120.2 kg   SpO2 93%   BMI 34.96 kg/m    General: Awake, no distress.  CV:  Good peripheral perfusion.  Resp:  Normal effort.  Abd:  No distention.  Other:  Left TM bulging with fluid.  Posterior oropharynx mildly erythematous without tonsillar  swelling, exudates or peritonsillar abscess.  There is no hoarse or muffled voice.  There is no drooling.  No cervical lymphadenopathy.   ED Results / Procedures / Treatments  Labs (all labs ordered are listed, but only abnormal results are displayed) Labs Reviewed  GROUP A STREP BY PCR  RESP PANEL BY RT-PCR (RSV, FLU A&B, COVID)  [id number]     EKG  None   RADIOLOGY None   Official radiology report(s): No results found.   PROCEDURES:  Critical Care performed: No  Procedures   MEDICATIONS ORDERED IN ED: Medications  predniSONE (DELTASONE) tablet 30 mg (30 mg Oral Given 09/28/22 0112)  ibuprofen (ADVIL) tablet 800 mg (800 mg Oral Given 09/28/22 0112)  lidocaine (PF) (XYLOCAINE) 1 % injection 2 mL (2 mLs Other Given 09/28/22 0113)     IMPRESSION / MDM / ASSESSMENT AND PLAN / ED COURSE  I reviewed the triage vital signs and the nursing notes.                             32 year old male presenting with otalgia secondary to eustachian tube dysfunction.  Will start steroid taper, NSAIDs, lidocaine drops for pain.  Strict return precautions given.  Patient verbalizes understanding and agrees with plan of care.  Patient's presentation is most consistent with acute, uncomplicated illness.  FINAL CLINICAL IMPRESSION(S) / ED DIAGNOSES   Final diagnoses:  Left ear pain  Dysfunction of left eustachian tube     Rx / DC Orders   ED Discharge Orders          Ordered    methylPREDNISolone (MEDROL DOSEPAK) 4 MG TBPK tablet        09/28/22 0052    HYDROcodone-acetaminophen (NORCO) 5-325 MG tablet  Every 6 hours PRN        09/28/22 0052             Note:  This document was prepared using Dragon voice recognition software and may include unintentional dictation errors.   Paulette Blanch, MD 10/04/22 [phone]

## 2023-06-22 ENCOUNTER — Emergency Department: Payer: 59

## 2023-06-22 ENCOUNTER — Other Ambulatory Visit: Payer: Self-pay

## 2023-06-22 ENCOUNTER — Emergency Department
Admission: EM | Admit: 2023-06-22 | Discharge: 2023-06-22 | Disposition: A | Discharge status: Home / Self Care | Payer: 59 | Attending: Emergency Medicine | Admitting: Emergency Medicine

## 2023-06-22 DIAGNOSIS — Y92410 Unspecified street and highway as the place of occurrence of the external cause: Secondary | ICD-10-CM | POA: Diagnosis not present

## 2023-06-22 DIAGNOSIS — M546 Pain in thoracic spine: Secondary | ICD-10-CM | POA: Diagnosis not present

## 2023-06-22 DIAGNOSIS — M25552 Pain in left hip: Secondary | ICD-10-CM | POA: Diagnosis not present

## 2023-06-22 DIAGNOSIS — M545 Low back pain, unspecified: Secondary | ICD-10-CM | POA: Diagnosis not present

## 2023-06-22 DIAGNOSIS — R519 Headache, unspecified: Secondary | ICD-10-CM | POA: Diagnosis not present

## 2023-06-22 DIAGNOSIS — R102 Pelvic and perineal pain: Secondary | ICD-10-CM | POA: Diagnosis not present

## 2023-06-22 DIAGNOSIS — M25551 Pain in right hip: Secondary | ICD-10-CM | POA: Insufficient documentation

## 2023-06-22 DIAGNOSIS — S161XXA Strain of muscle, fascia and tendon at neck level, initial encounter: Secondary | ICD-10-CM | POA: Insufficient documentation

## 2023-06-22 DIAGNOSIS — R739 Hyperglycemia, unspecified: Secondary | ICD-10-CM | POA: Diagnosis not present

## 2023-06-22 DIAGNOSIS — M549 Dorsalgia, unspecified: Secondary | ICD-10-CM | POA: Diagnosis not present

## 2023-06-22 DIAGNOSIS — S199XXA Unspecified injury of neck, initial encounter: Secondary | ICD-10-CM | POA: Diagnosis not present

## 2023-06-22 DIAGNOSIS — I10 Essential (primary) hypertension: Secondary | ICD-10-CM | POA: Diagnosis not present

## 2023-06-22 DIAGNOSIS — M542 Cervicalgia: Secondary | ICD-10-CM | POA: Diagnosis not present

## 2023-06-22 DIAGNOSIS — T148XXA Other injury of unspecified body region, initial encounter: Secondary | ICD-10-CM

## 2023-06-22 DIAGNOSIS — R001 Bradycardia, unspecified: Secondary | ICD-10-CM | POA: Diagnosis not present

## 2023-06-22 MED ORDER — ACETAMINOPHEN 500 MG PO TABS
1000.0000 mg | ORAL_TABLET | Freq: Once | ORAL | Status: AC
  Administered 2023-06-22: 1000 mg via ORAL
  Filled 2023-06-22: qty 2

## 2023-06-22 MED ORDER — OXYCODONE HCL 5 MG PO TABS
5.0000 mg | ORAL_TABLET | Freq: Once | ORAL | Status: AC
  Administered 2023-06-22: 5 mg via ORAL
  Filled 2023-06-22: qty 1

## 2023-06-22 MED ORDER — HYDROCODONE-ACETAMINOPHEN 5-325 MG PO TABS: 1.0000 | ORAL_TABLET | Freq: Four times a day (QID) | ORAL | 0 refills | Status: AC | PRN

## 2023-06-22 MED ORDER — IBUPROFEN 600 MG PO TABS: 600.0000 mg | ORAL_TABLET | Freq: Three times a day (TID) | ORAL | 0 refills | Status: AC | PRN
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
## 2023-06-22 NOTE — Discharge Instructions (Addendum)
Follow-up with your primary care fighter if any continued problems or concerns.  You may also follow-up with urgent care or return to the emergency department if any worsening of your symptoms or not improving.  Prescriptions were sent to your pharmacy to begin taking.  You may also use ice or heat to your muscles as needed for discomfort.

## 2023-07-03 ENCOUNTER — Other Ambulatory Visit (INDEPENDENT_AMBULATORY_CARE_PROVIDER_SITE_OTHER): Payer: Self-pay | Admitting: Emergency Medicine

## 2024-07-14 ENCOUNTER — Emergency Department

## 2024-07-14 ENCOUNTER — Emergency Department
Admission: EM | Admit: 2024-07-14 | Discharge: 2024-07-14 | Disposition: A | Discharge status: Home / Self Care | Attending: Emergency Medicine | Admitting: Emergency Medicine

## 2024-07-14 DIAGNOSIS — R059 Cough, unspecified: Secondary | ICD-10-CM | POA: Diagnosis present

## 2024-07-14 DIAGNOSIS — J069 Acute upper respiratory infection, unspecified: Secondary | ICD-10-CM | POA: Diagnosis not present

## 2024-07-14 LAB — RESP PANEL BY RT-PCR (RSV, FLU A&B, COVID)  RVPGX2
Influenza A by PCR: NEGATIVE
Influenza B by PCR: NEGATIVE
Resp Syncytial Virus by PCR: NEGATIVE
SARS Coronavirus 2 by RT PCR: NEGATIVE

## 2024-07-14 MED ORDER — GUAIFENESIN-CODEINE 100-10 MG/5ML PO SOLN: 10.0000 mL | Freq: Four times a day (QID) | ORAL | 0 refills | Status: AC | PRN

## 2024-07-14 MED ORDER — BENZONATATE 100 MG PO CAPS: 100.0000 mg | ORAL_CAPSULE | Freq: Three times a day (TID) | ORAL | 0 refills | Status: AC | PRN

## 2024-07-14 NOTE — ED Provider Notes (Signed)
 [facility] Provider Note    Event Date/Time   First MD Initiated Contact with Patient 07/14/24 1333     (approximate)   History   Cough   HPI  XAIDYN KEPNER is a 33 y.o. male with PMH of seizures and migraine presents for evaluation of cough and congestion for 3 days.  Patient has been taking over-the-counter cough medicine at home to treat his symptoms.  Patient endorses fever but did not quantify with thermometer, also having chills.      Physical Exam   Triage Vital Signs: ED Triage Vitals  Encounter Vitals Group     BP 07/14/24 1252 121/89     Girls Systolic BP Percentile --      Girls Diastolic BP Percentile --      Boys Systolic BP Percentile --      Boys Diastolic BP Percentile --      Pulse Rate 07/14/24 1252 85     Resp 07/14/24 1252 18     Temp 07/14/24 1252 99.3 F (37.4 C)     Temp Source 07/14/24 1252 Oral     SpO2 07/14/24 1252 99 %     Weight 07/14/24 1253 250 lb (113.4 kg)     Height 07/14/24 1253 6' 1 (1.854 m)     Head Circumference --      Peak Flow --      Pain Score 07/14/24 1252 0     Pain Loc --      Pain Education --      Exclude from Growth Chart --     Most recent vital signs: Vitals:   07/14/24 1252  BP: 121/89  Pulse: 85  Resp: 18  Temp: 99.3 F (37.4 C)  SpO2: 99%   General: Awake, coughing. CV:  Good peripheral perfusion.  RRR. Resp:  Normal effort.  CTAB. Abd:  No distention.  Other:     ED Results / Procedures / Treatments   Labs (all labs ordered are listed, but only abnormal results are displayed) Labs Reviewed  RESP PANEL BY RT-PCR (RSV, FLU A&B, COVID)  [id number]    RADIOLOGY  Chest x-ray obtained, I interpreted the images as well as reviewed the radiologist report, which was negative for any acute cardiopulmonary abnormalities.  PROCEDURES:  Critical Care performed: No  Procedures   MEDICATIONS ORDERED IN ED: Medications - No data to display   IMPRESSION / MDM /  ASSESSMENT AND PLAN / ED COURSE  I reviewed the triage vital signs and the nursing notes.                             33 year old male presents for evaluation of cough.  Vital signs are stable patient NAD on exam.  Differential diagnosis includes, but is not limited to, flu, COVID, RSV, bronchitis, pneumonia, other viral infection.  Patient's presentation is most consistent with acute complicated illness / injury requiring diagnostic workup.  Respiratory panel is negative.  Chest x-ray is also negative.  Suspect viral upper respiratory infection.  Advise on symptomatic management using over-the-counter cold medicines.  Will also send Tessalon  Perles as well as some cough syrup.  Patient did not need a note for work.  He voiced understanding, all questions were answered and he was stable at discharge.    FINAL CLINICAL IMPRESSION(S) / ED DIAGNOSES   Final diagnoses:  Viral URI with cough     Rx / DC Orders  ED Discharge Orders          Ordered    guaiFENesin -codeine  100-10 MG/5ML syrup  Every 6 hours PRN        07/14/24 1439    benzonatate  (TESSALON ) 100 MG capsule  3 times daily PRN        07/14/24 1439             Note:  This document was prepared using Dragon voice recognition software and may include unintentional dictation errors.   Cleaster Tinnie LABOR, PA-C 07/14/24 1440    Arlander [place], MD 07/14/24 1444

## 2024-07-14 NOTE — ED Triage Notes (Signed)
 Pt presents to the ED via POV from home with congestion and a productive cough x3 days. Pt reports coughing up green stuff. Pt A&Ox4 at time of triage.

## 2024-07-14 NOTE — Discharge Instructions (Addendum)
 You tested negative for flu, COVID and RSV.  Your symptoms are consistent with a viral illness which will resolve on its own with time.  You do not need an antibiotic.  You can take over-the-counter cold medicine as needed to manage your symptoms.  If you are taking combination cold medicine keep in mind that this often contains Tylenol  so if you need additional medication for body aches or fever control please take Motrin  or ibuprofen .  Your symptoms should resolve with time, if you have had symptoms for greater than 10 days please be evaluated by another healthcare provider as at this point it may have developed into a bacterial infection which requires a different treatment.  Return to the emergency department with worsening symptoms.
